# Patient Record
Sex: Male | Born: 1955
Health system: Southern US, Community
[De-identification: ages and names within clinical notes are randomized; demographics above are authoritative.]

## PROBLEM LIST (undated history)

## (undated) DIAGNOSIS — E785 Hyperlipidemia, unspecified: Secondary | ICD-10-CM

## (undated) DIAGNOSIS — I10 Essential (primary) hypertension: Secondary | ICD-10-CM

## (undated) DIAGNOSIS — K5901 Slow transit constipation: Secondary | ICD-10-CM

## (undated) DIAGNOSIS — E119 Type 2 diabetes mellitus without complications: Secondary | ICD-10-CM

## (undated) DIAGNOSIS — E7800 Pure hypercholesterolemia, unspecified: Secondary | ICD-10-CM

## (undated) HISTORY — PX: OTHER SURGICAL HISTORY: SHX169

## (undated) HISTORY — DX: Slow transit constipation: K59.01

## (undated) HISTORY — PX: APPENDECTOMY: SHX54

## (undated) HISTORY — DX: Hyperlipidemia, unspecified: E78.5

## (undated) HISTORY — DX: Essential (primary) hypertension: I10

---

## 2013-01-02 ENCOUNTER — Telehealth: Payer: Self-pay

## 2013-01-02 NOTE — Telephone Encounter (Signed)
Pt was referred by [facility] for screening colonoscopy. I called him and he is not having any problems at this time and he said he does not want to do it yet. [place] Spaniel [facility] and he will discuss again with them then. He will call when he is ready. I will send a letter to [facility] informing them of his decision.   Please note: the referral had his date of birth as 01/19/1956 The computer has his date of birth as May 13, 1955  Which he said is correct

## 2018-01-19 ENCOUNTER — Ambulatory Visit (INDEPENDENT_AMBULATORY_CARE_PROVIDER_SITE_OTHER): Admitting: Family Medicine

## 2018-01-19 ENCOUNTER — Inpatient Hospital Stay (HOSPITAL_COMMUNITY)
Admission: EM | Admit: 2018-01-19 | Discharge: 2018-01-23 | DRG: 041 | Disposition: A | Discharge status: Rehabilitation Facility | Attending: Internal Medicine | Admitting: Internal Medicine

## 2018-01-19 ENCOUNTER — Encounter (HOSPITAL_COMMUNITY): Payer: Self-pay | Admitting: Emergency Medicine

## 2018-01-19 ENCOUNTER — Emergency Department (HOSPITAL_COMMUNITY)

## 2018-01-19 ENCOUNTER — Other Ambulatory Visit: Payer: Self-pay

## 2018-01-19 ENCOUNTER — Encounter: Payer: Self-pay | Admitting: Family Medicine

## 2018-01-19 VITALS — BP 190/136 | HR 94 | Resp 17 | Ht 72.0 in | Wt 227.0 lb

## 2018-01-19 DIAGNOSIS — Z823 Family history of stroke: Secondary | ICD-10-CM

## 2018-01-19 DIAGNOSIS — Z7689 Persons encountering health services in other specified circumstances: Secondary | ICD-10-CM | POA: Diagnosis not present

## 2018-01-19 DIAGNOSIS — S0990XA Unspecified injury of head, initial encounter: Secondary | ICD-10-CM

## 2018-01-19 DIAGNOSIS — I10 Essential (primary) hypertension: Secondary | ICD-10-CM | POA: Diagnosis present

## 2018-01-19 DIAGNOSIS — I69393 Ataxia following cerebral infarction: Secondary | ICD-10-CM | POA: Diagnosis not present

## 2018-01-19 DIAGNOSIS — I63111 Cerebral infarction due to embolism of right vertebral artery: Secondary | ICD-10-CM | POA: Diagnosis present

## 2018-01-19 DIAGNOSIS — E1159 Type 2 diabetes mellitus with other circulatory complications: Secondary | ICD-10-CM | POA: Diagnosis present

## 2018-01-19 DIAGNOSIS — I639 Cerebral infarction, unspecified: Secondary | ICD-10-CM

## 2018-01-19 DIAGNOSIS — E1169 Type 2 diabetes mellitus with other specified complication: Secondary | ICD-10-CM | POA: Diagnosis not present

## 2018-01-19 DIAGNOSIS — R200 Anesthesia of skin: Secondary | ICD-10-CM | POA: Diagnosis not present

## 2018-01-19 DIAGNOSIS — E785 Hyperlipidemia, unspecified: Secondary | ICD-10-CM

## 2018-01-19 DIAGNOSIS — Z9114 Patient's other noncompliance with medication regimen: Secondary | ICD-10-CM

## 2018-01-19 DIAGNOSIS — K5901 Slow transit constipation: Secondary | ICD-10-CM | POA: Diagnosis not present

## 2018-01-19 DIAGNOSIS — I160 Hypertensive urgency: Secondary | ICD-10-CM | POA: Diagnosis present

## 2018-01-19 DIAGNOSIS — G8194 Hemiplegia, unspecified affecting left nondominant side: Secondary | ICD-10-CM | POA: Diagnosis present

## 2018-01-19 DIAGNOSIS — I69398 Other sequelae of cerebral infarction: Secondary | ICD-10-CM | POA: Diagnosis not present

## 2018-01-19 DIAGNOSIS — R270 Ataxia, unspecified: Secondary | ICD-10-CM | POA: Diagnosis not present

## 2018-01-19 DIAGNOSIS — I63443 Cerebral infarction due to embolism of bilateral cerebellar arteries: Secondary | ICD-10-CM | POA: Diagnosis not present

## 2018-01-19 DIAGNOSIS — R739 Hyperglycemia, unspecified: Secondary | ICD-10-CM

## 2018-01-19 DIAGNOSIS — H5509 Other forms of nystagmus: Secondary | ICD-10-CM | POA: Diagnosis not present

## 2018-01-19 DIAGNOSIS — E876 Hypokalemia: Secondary | ICD-10-CM

## 2018-01-19 DIAGNOSIS — I6389 Other cerebral infarction: Secondary | ICD-10-CM | POA: Diagnosis not present

## 2018-01-19 DIAGNOSIS — I6350 Cerebral infarction due to unspecified occlusion or stenosis of unspecified cerebral artery: Secondary | ICD-10-CM | POA: Diagnosis not present

## 2018-01-19 DIAGNOSIS — E1165 Type 2 diabetes mellitus with hyperglycemia: Secondary | ICD-10-CM | POA: Diagnosis present

## 2018-01-19 DIAGNOSIS — Q211 Atrial septal defect: Secondary | ICD-10-CM | POA: Diagnosis not present

## 2018-01-19 DIAGNOSIS — Z9181 History of falling: Secondary | ICD-10-CM | POA: Diagnosis not present

## 2018-01-19 DIAGNOSIS — I459 Conduction disorder, unspecified: Secondary | ICD-10-CM | POA: Diagnosis present

## 2018-01-19 DIAGNOSIS — R29700 NIHSS score 0: Secondary | ICD-10-CM | POA: Diagnosis not present

## 2018-01-19 DIAGNOSIS — R269 Unspecified abnormalities of gait and mobility: Secondary | ICD-10-CM | POA: Diagnosis not present

## 2018-01-19 DIAGNOSIS — R262 Difficulty in walking, not elsewhere classified: Secondary | ICD-10-CM | POA: Diagnosis present

## 2018-01-19 DIAGNOSIS — E669 Obesity, unspecified: Secondary | ICD-10-CM | POA: Diagnosis not present

## 2018-01-19 HISTORY — DX: Hyperlipidemia, unspecified: E78.5

## 2018-01-19 HISTORY — DX: Type 2 diabetes mellitus with other circulatory complications: E11.59

## 2018-01-19 HISTORY — DX: Hypertensive urgency: I16.0

## 2018-01-19 HISTORY — DX: Cerebral infarction, unspecified: I63.9

## 2018-01-19 HISTORY — DX: Essential (primary) hypertension: I10

## 2018-01-19 HISTORY — DX: Type 2 diabetes mellitus without complications: E11.9

## 2018-01-19 HISTORY — DX: Pure hypercholesterolemia, unspecified: E78.00

## 2018-01-19 LAB — CBC WITH DIFFERENTIAL/PLATELET
ABS IMMATURE GRANULOCYTES: 0.02 10*3/uL (ref 0.00–0.07)
Basophils Absolute: 0 10*3/uL (ref 0.0–0.1)
Basophils Relative: 1 %
EOS ABS: 0.1 10*3/uL (ref 0.0–0.5)
Eosinophils Relative: 1 %
HCT: 54.2 % — ABNORMAL HIGH (ref 39.0–52.0)
Hemoglobin: 18.1 g/dL — ABNORMAL HIGH (ref 13.0–17.0)
Immature Granulocytes: 0 %
Lymphocytes Relative: 24 %
Lymphs Abs: 1.9 10*3/uL (ref 0.7–4.0)
MCH: 29.5 pg (ref 26.0–34.0)
MCHC: 33.4 g/dL (ref 30.0–36.0)
MCV: 88.3 fL (ref 80.0–100.0)
MONOS PCT: 7 %
Monocytes Absolute: 0.6 10*3/uL (ref 0.1–1.0)
Neutro Abs: 5.5 10*3/uL (ref 1.7–7.7)
Neutrophils Relative %: 67 %
Platelets: 310 10*3/uL (ref 150–400)
RBC: 6.14 MIL/uL — ABNORMAL HIGH (ref 4.22–5.81)
RDW: 11.9 % (ref 11.5–15.5)
WBC: 8.1 10*3/uL (ref 4.0–10.5)
nRBC: 0 % (ref 0.0–0.2)

## 2018-01-19 LAB — BASIC METABOLIC PANEL
ANION GAP: 16 — AB (ref 5–15)
BUN: 13 mg/dL (ref 8–23)
CO2: 20 mmol/L — ABNORMAL LOW (ref 22–32)
Calcium: 9.6 mg/dL (ref 8.9–10.3)
Chloride: 100 mmol/L (ref 98–111)
Creatinine, Ser: 1.14 mg/dL (ref 0.61–1.24)
GFR calc Af Amer: >60 mL/min (ref >60)
GFR calc non Af Amer: >60 mL/min (ref >60)
Glucose, Bld: 156 mg/dL — ABNORMAL HIGH (ref 70–99)
Potassium: 3.5 mmol/L (ref 3.5–5.1)
Sodium: 136 mmol/L (ref 135–145)

## 2018-01-19 LAB — I-STAT TROPONIN, ED: TROPONIN I, POC: 0.02 ng/mL (ref 0.00–0.08)

## 2018-01-19 LAB — CBG MONITORING, ED: GLUCOSE-CAPILLARY: 147 mg/dL — AB (ref 70–99)

## 2018-01-19 LAB — GLUCOSE, POCT (MANUAL RESULT ENTRY): POC Glucose: >600 mg/dl (ref 70–99)

## 2018-01-19 MED ORDER — SODIUM CHLORIDE 0.9 % IV SOLN
INTRAVENOUS | Status: AC
  Administered 2018-01-19: 22:00:00 via INTRAVENOUS

## 2018-01-19 MED ORDER — ACETAMINOPHEN 160 MG/5ML PO SOLN: 650.0000 mg | ORAL | Status: DC | PRN

## 2018-01-19 MED ORDER — INSULIN ASPART 100 UNIT/ML ~~LOC~~ SOLN
0.0000 [IU] | Freq: Three times a day (TID) | SUBCUTANEOUS | Status: DC
  Administered 2018-01-20: 1 [IU] via SUBCUTANEOUS
  Administered 2018-01-20: 2 [IU] via SUBCUTANEOUS
  Administered 2018-01-21 (×3): 1 [IU] via SUBCUTANEOUS
  Administered 2018-01-22: 2 [IU] via SUBCUTANEOUS
  Administered 2018-01-22 (×2): 1 [IU] via SUBCUTANEOUS
  Administered 2018-01-23: 2 [IU] via SUBCUTANEOUS

## 2018-01-19 MED ORDER — ENOXAPARIN SODIUM 40 MG/0.4ML ~~LOC~~ SOLN
40.0000 mg | SUBCUTANEOUS | Status: DC
  Administered 2018-01-19 – 2018-01-22 (×4): 40 mg via SUBCUTANEOUS
  Filled 2018-01-19 (×4): qty 0.4

## 2018-01-19 MED ORDER — ACETAMINOPHEN 325 MG PO TABS: 650.0000 mg | ORAL_TABLET | ORAL | Status: DC | PRN

## 2018-01-19 MED ORDER — GADOBUTROL 1 MMOL/ML IV SOLN
10.0000 mL | Freq: Once | INTRAVENOUS | Status: AC | PRN
  Administered 2018-01-19: 10 mL via INTRAVENOUS

## 2018-01-19 MED ORDER — CLOPIDOGREL BISULFATE 75 MG PO TABS
75.0000 mg | ORAL_TABLET | Freq: Every day | ORAL | Status: DC
  Administered 2018-01-20 – 2018-01-23 (×4): 75 mg via ORAL
  Filled 2018-01-19 (×4): qty 1

## 2018-01-19 MED ORDER — HYDRALAZINE HCL 20 MG/ML IJ SOLN
10.0000 mg | Freq: Once | INTRAMUSCULAR | Status: AC
  Administered 2018-01-19: 10 mg via INTRAVENOUS
  Filled 2018-01-19: qty 1

## 2018-01-19 MED ORDER — CLOPIDOGREL BISULFATE 300 MG PO TABS
300.0000 mg | ORAL_TABLET | Freq: Once | ORAL | Status: AC
  Administered 2018-01-20: 300 mg via ORAL
  Filled 2018-01-19: qty 4
  Filled 2018-01-19: qty 1

## 2018-01-19 MED ORDER — SODIUM CHLORIDE 0.9 % IV BOLUS
1000.0000 mL | Freq: Once | INTRAVENOUS | Status: AC
  Administered 2018-01-19: 1000 mL via INTRAVENOUS

## 2018-01-19 MED ORDER — STROKE: EARLY STAGES OF RECOVERY BOOK
Freq: Once | Status: AC
  Administered 2018-01-19: 23:00:00
  Filled 2018-01-19: qty 1

## 2018-01-19 MED ORDER — ATORVASTATIN CALCIUM 80 MG PO TABS
80.0000 mg | ORAL_TABLET | Freq: Every day | ORAL | Status: DC
  Administered 2018-01-20 – 2018-01-23 (×4): 80 mg via ORAL
  Filled 2018-01-19 (×4): qty 1

## 2018-01-19 MED ORDER — IOPAMIDOL (ISOVUE-370) INJECTION 76%
INTRAVENOUS | Status: AC
  Filled 2018-01-19: qty 100

## 2018-01-19 MED ORDER — IOPAMIDOL (ISOVUE-370) INJECTION 76%
75.0000 mL | Freq: Once | INTRAVENOUS | Status: AC | PRN
  Administered 2018-01-19: 75 mL via INTRAVENOUS

## 2018-01-19 MED ORDER — SENNOSIDES-DOCUSATE SODIUM 8.6-50 MG PO TABS: 1.0000 | ORAL_TABLET | Freq: Every evening | ORAL | Status: DC | PRN

## 2018-01-19 MED ORDER — ACETAMINOPHEN 650 MG RE SUPP: 650.0000 mg | RECTAL | Status: DC | PRN

## 2018-01-19 NOTE — Progress Notes (Signed)
Charise CarwinWilliam Large, is a 62 y.o. male  HKV:[id number]:[id number]  FIE:[id number]  DOB - November 05, 1955  CC:  Chief Complaint  Patient presents with  . Establish Care       HPI: Chrissie NoaWilliam is a 62 y.o. male is here today to establish care.   Donneta RombergWilliam Ray Hefter Jr. does not have a problem list on file.   Today's visit:  Patient presents today as a brand-new patient and to establish care accompanied by family member who was concerned when she visited him from out of town and has noticed the patient has been falling, experiencing double vision, and left-sided weakness over the course of the last week.  Patient has sustained a fall approximately 3 weeks ago in which he fell backward and hit the back of his head.  Patient's blood pressure is significantly elevated on exam today pressure by me 190/136.  He has limited medical history.  [facility] [place].  Reports been off of his medications for over a year.  He was previously prescribed metformin and lisinopril HCTZ. Has not had his diabetes checked in some time.  He has no history of stroke or MI.  At present he is complaining of feeling like he is out of body, feeling dizzy, and feeling significantly blurring of his vision. At present he is experiencing intermittent left-sided weakness involving the entire left side of his body. Denies chest pain or shortness of breath.    Current medications:No current outpatient medications on file.   Pertinent family medical history: family history is not on file.   Allergies not on file  Social History   Socioeconomic History  . Marital status: Unknown    Spouse name: Not on file  . Number of children: Not on file  . Years of education: Not on file  . Highest education level: Not on file  Occupational History  . Not on file  Social Needs  . Financial resource strain: Not on file  . Food insecurity:    Worry: Not on file    Inability: Not on file  . Transportation needs:   Medical: Not on file    Non-medical: Not on file  Tobacco Use  . Smoking status: Not on file  Substance and Sexual Activity  . Alcohol use: Not on file  . Drug use: Not on file  . Sexual activity: Not on file  Lifestyle  . Physical activity:    Days per week: Not on file    Minutes per session: Not on file  . Stress: Not on file  Relationships  . Social connections:    Talks on phone: Not on file    Gets together: Not on file    Attends religious service: Not on file    Active member of club or organization: Not on file    Attends meetings of clubs or organizations: Not on file    Relationship status: Not on file  . Intimate partner violence:    Fear of current or ex partner: Not on file    Emotionally abused: Not on file    Physically abused: Not on file    Forced sexual activity: Not on file  Other Topics Concern  . Not on file  Social History Narrative  . Not on file    Review of Systems: See HPI  Objective:   Vitals:   01/19/18 1209 01/19/18 1212  BP: (!) 180/124 (!) 190/136  Pulse: 94   Resp: 17  SpO2: 98%     BP Readings from Last 3 Encounters:  01/19/18 (!) 190/136    Filed Weights   01/19/18 1209  Weight: 227 lb (103 kg)      Physical Exam: Constitutional: no acute distress Eyes: Conjunctivae and EOM are normal. PERRLA, no scleral icterus. Neck: Normal ROM. Neck supple. No JVD. No tracheal deviation. No thyromegaly. CVS: RRR, S1/S2 +, no murmurs, no gallops, no carotid bruit.  Pulmonary: Effort and breath sounds normal, no stridor, rhonchi, wheezes, rales.  Abdominal: Soft. BS +, no distension, tenderness, rebound or guarding.  Musculoskeletal: weakness with ambulation, currently place in wheelchair. Difficulty ambulation to scale Lymphadenopathy: No lymphadenopathy noted, cervical, inguinal or axillary Neuro: Alert. Impaired coordination. CN II abnormal diminished visual acuity. Unilateral BUE strength Skin: Skin is warm and dry. No rash noted.  Not diaphoretic. No erythema. No pallor. Psychiatric: Normal mood and affect. Behavior, judgment, thought content normal.     Assessment and plan:  1. Encounter to establish care 2. Injury of head, initial encounter 3. Left sided numbness - EKG 12-Lead 4. Hyperglycemia  Patient transported via EMS to [facility] given current stroke like symptoms. EMS rechecked blood sugar while patient was in ambulance and reports blood sugar reading of 200, however given patient's active symptoms , lack of medical history, emergent work-up needed at the ER to rule out any ischemic event.   Joaquin Courts, [name] Primary Care at [facility] [address], [place] [address] [phone]: [phone]   This note has been created with Dragon speech recognition software and Paediatric nurse. Any transcriptional errors are unintentional.

## 2018-01-19 NOTE — ED Provider Notes (Signed)
3:46 PM BP (!) 208/105 (BP Location: Right Arm)   Pulse 77   Temp 98.9 F (37.2 C) (Oral)   Resp 18   SpO2 98%  Patient taken in sign out at shift change from PA BensleyGibbons. Patient awaiting MRI for 1 week of feeling off balance in the setting of hypertensive urgency and HTN which has been untreated fort the past year.  Able to ambulate.  Expect discharge of negative mri. Patient has taken HCTZ-Losartan combo in the past.  Patient MRI returned with multiple infarcts and varied location concerning for embolic strokes.  I spoke with Dr. Otelia LimesLindzen.  I have ordered a CT angios head and neck for further evaluation.   Patient CT returned with thrombus in the vertebral artery.  I spoke with Dr. Amada JupiterKirkpatrick who is seeing the patient.  He will be admitted to the hospitalist service.  He says there is no intervention to be done at this time and to permit hypertension in the patient.   CRITICAL CARE Performed by: Arthor CaptainAbigail Roshun Klingensmith Total critical care time: 40 minutes Acute embolic stroke Critical care time was exclusive of separately billable procedures and treating other patients. Critical care was necessary to treat or prevent imminent or life-threatening deterioration. Critical care was time spent personally by me on the following activities: development of treatment plan with patient and/or surrogate as well as nursing, discussions with consultants, evaluation of patient's response to treatment, examination of patient, obtaining history from patient or surrogate, ordering and performing treatments and interventions, ordering and review of laboratory studies, ordering and review of radiographic studies, pulse oximetry and re-evaluation of patient's condition.]     Arthor CaptainHarris, Jeet Shough, PA-C 01/20/18 0002    Virgina Norfolkuratolo, Adam, DO 01/20/18 0241

## 2018-01-19 NOTE — ED Notes (Signed)
ED Provider at bedside. 

## 2018-01-19 NOTE — Patient Instructions (Signed)
Thank you for [facility]!    Ricky RombergWilliam Ray Freels Jr. was seen by Joaquin CourtsKimberly Harris, [name] today.   Ricky BloodgoodWilliam Ray Coil Jr.'s primary care provider is Bing NeighborsHarris, Kimberly S, [name].   For the best care possible, you should try to see Joaquin CourtsKimberly Harris, [name]-C whenever you come to the clinic.   We look forward to seeing you again soon!  If you have any questions about your visit today, please call us at [phone] or feel free to reach your primary care provider via MyChart.

## 2018-01-19 NOTE — H&P (Signed)
History and Physical    Ricky Bright. ZDG:[id number] DOB: 04-17-1955 DOA: 01/19/2018  PCP: Bing Neighbors, [name]  Patient coming from: Home.  Chief Complaint: Difficulty walking.  HPI: Ricky Bright. is a 62 y.o. male with history of hypertension and diabetes mellitus type 2 hyperlipidemia who has not been taking his medications for last few months presents to the ER because of persistent ataxia on walking.  Patient states he fell off the truck 3 weeks ago and last 1 week has been having difficulty with balance when he is walking.  Denies any difficulty swallowing speaking or any visual symptoms.  Denies any weakness of the upper or lower extremities.  Since symptoms persisted patient came to the ER.  ED Course: In the ER patient had MRI of the brain which shows multiple areas of subacute to acute infarcts involving the cerebellum right occipital and thalamic areas.  CT angiogram of the head and neck was done which shows occlusion of the right vertebral artery.  Neurology on-call has been consulted patient was placed on Plavix 300 mg followed by 75 daily along with aspirin and admitted for further management of acute stroke.  Review of Systems: As per HPI, rest all negative.   Past Medical History:  Diagnosis Date  . Diabetes mellitus without complication (HCC)   . HLD (hyperlipidemia)   . Hypercholesteremia   . Hypertension     Past Surgical History:  Procedure Laterality Date  . APPENDECTOMY    . bicep tendon rupture.       reports that he has never smoked. He has never used smokeless tobacco. He reports previous alcohol use. He reports that he does not use drugs.  No Known Allergies  Family History  Problem Relation Age of Onset  . Stroke Father   . Stroke Paternal Grandfather     Prior to Admission medications   Not on File    Physical Exam: Vitals:   01/19/18 1506 01/19/18 1800 01/19/18 2107 01/19/18 2132  BP: (!) 208/105 (!) 186/94 (!) 168/102  (!) 195/99  Pulse: 77 76 74 68  Resp: 18 18 16 17   Temp: 98.9 F (37.2 C)   98.4 F (36.9 C)  TempSrc: Oral   Oral  SpO2: 98% 96% 97% 94%  Weight:    97.7 kg  Height:    6' (1.829 m)      Constitutional: Moderately built and nourished. Vitals:   01/19/18 1506 01/19/18 1800 01/19/18 2107 01/19/18 2132  BP: (!) 208/105 (!) 186/94 (!) 168/102 (!) 195/99  Pulse: 77 76 74 68  Resp: 18 18 16 17   Temp: 98.9 F (37.2 C)   98.4 F (36.9 C)  TempSrc: Oral   Oral  SpO2: 98% 96% 97% 94%  Weight:    97.7 kg  Height:    6' (1.829 m)   Eyes: Anicteric no pallor. ENMT: No discharge from the ears eyes nose or mouth. Neck: No mass felt.  No neck rigidity. Respiratory: No rhonchi or crepitations. Cardiovascular: S1-S2 heard. Abdomen: Soft nontender bowel sounds present. Musculoskeletal: No edema.  No joint effusion. Skin: No rash. Neurologic: Alert awake oriented to time place and person.  Moves all extremities 5 x 5.  No facial asymmetry tongue is midline.  Pupils equal and reacting to light. Psychiatric: Appears normal per normal affect.   Labs on Admission: I have personally reviewed following labs and imaging studies  CBC: Recent Labs  Lab 01/19/18 1316  WBC 8.1  NEUTROABS  5.5  HGB 18.1*  HCT 54.2*  MCV 88.3  PLT 310   Basic Metabolic Panel: Recent Labs  Lab 01/19/18 1316  NA 136  K 3.5  CL 100  CO2 20*  GLUCOSE 156*  BUN 13  CREATININE 1.14  CALCIUM 9.6   GFR: Estimated Creatinine Clearance: 79.3 mL/min (by C-G formula based on SCr of 1.14 mg/dL). Liver Function Tests: No results for input(s): AST, ALT, ALKPHOS, BILITOT, PROT, ALBUMIN in the last 168 hours. No results for input(s): LIPASE, AMYLASE in the last 168 hours. No results for input(s): AMMONIA in the last 168 hours. Coagulation Profile: No results for input(s): INR, PROTIME in the last 168 hours. Cardiac Enzymes: No results for input(s): CKTOTAL, CKMB, CKMBINDEX, TROPONINI in the last 168  hours. BNP (last 3 results) No results for input(s): PROBNP in the last 8760 hours. HbA1C: No results for input(s): HGBA1C in the last 72 hours. CBG: Recent Labs  Lab 01/19/18 1310  GLUCAP 147*   Lipid Profile: No results for input(s): CHOL, HDL, LDLCALC, TRIG, CHOLHDL, LDLDIRECT in the last 72 hours. Thyroid Function Tests: No results for input(s): TSH, T4TOTAL, FREET4, T3FREE, THYROIDAB in the last 72 hours. Anemia Panel: No results for input(s): VITAMINB12, FOLATE, FERRITIN, TIBC, IRON, RETICCTPCT in the last 72 hours. Urine analysis: No results found for: COLORURINE, APPEARANCEUR, LABSPEC, PHURINE, GLUCOSEU, HGBUR, BILIRUBINUR, KETONESUR, PROTEINUR, UROBILINOGEN, NITRITE, LEUKOCYTESUR Sepsis Labs: @LABRCNTIP (procalcitonin:4,lacticidven:4) )No results found for this or any previous visit (from the past 240 hour(s)).   Radiological Exams on Admission: Ct Angio Head W Or Wo Contrast  Result Date: 01/19/2018 CLINICAL DATA:  Larey Seat 2 weeks ago. Acute stroke presentation today with multiple posterior circulation infarctions. EXAM: CT ANGIOGRAPHY HEAD AND NECK TECHNIQUE: Multidetector CT imaging of the head and neck was performed using the standard protocol during bolus administration of intravenous contrast. Multiplanar CT image reconstructions and MIPs were obtained to evaluate the vascular anatomy. Carotid stenosis measurements (when applicable) are obtained utilizing NASCET criteria, using the distal internal carotid diameter as the denominator. CONTRAST:  75mL ISOVUE-370 IOPAMIDOL (ISOVUE-370) INJECTION 76% COMPARISON:  CT and MRI same day. FINDINGS: CTA NECK FINDINGS Aortic arch: Aortic atherosclerosis. No dissection. Branching pattern is normal with the variation of the left vertebral artery arising directly from the arch. No origin stenosis. Right carotid system: Common carotid artery widely patent to the bifurcation. Soft and calcified plaque at the carotid bifurcation and ICA bulb but  no stenosis. Cervical ICA is widely patent. Left carotid system: Common carotid artery widely patent to the bifurcation. There is soft and calcified plaque at the bifurcation and ICA bulb but no stenosis. Cervical ICA is widely patent. Vertebral arteries: Right vertebral artery origin shows 30% stenosis. Beyond that, the vessel shows diminishing flow, without opacification in the upper cervical region, consistent with recent occlusion. The left vertebral artery arises from the arch as mentioned before and is widely patent through the neck to the foramen magnum. Skeleton: Ordinary cervical spondylosis. Chronic fusion in the upper cervical region. Other neck: No soft tissue mass or lymphadenopathy. Vocal fold paresis on the right. Upper chest: Mild pleural and parenchymal scarring at the apices. Calcified granuloma posteriorly in the right upper lobe. Review of the MIP images confirms the above findings CTA HEAD FINDINGS Anterior circulation: Both internal carotid arteries are patent through the skull base and siphon regions. There is atherosclerotic calcification in the carotid siphon regions. There is stenosis in the supraclinoid internal carotid artery on both sides, estimated at 70-80%. The anterior  and middle cerebral vessels do show flow without more distal stenosis. Posterior circulation: As noted above, the right vertebral artery is occluded at the foramen magnum. This is probably a recent occlusion. Left vertebral artery is patent through the foramen magnum. There is severe stenosis of the V4 segment, 80% or greater, but the vessel does retain flow, supplying the basilar and giving retrograde flow in the distal right vertebral. There is a proximal basilar fenestration. No basilar stenosis. Superior cerebellar and posterior cerebral vessels show flow. Venous sinuses: Patent and normal. Anatomic variants: None other significant. Delayed phase: No abnormal enhancement. Review of the MIP images confirms the above  findings IMPRESSION: 1. Right vertebral artery occluded at the upper cervical region to foramen magnum level. This is probably a recent occlusion and explains the embolic disease within the posterior circulation branches. 2. 80% or greater stenosis of the left V4 segment. 3. Atherosclerotic disease at both carotid bifurcations but no stenosis. 4. Atherosclerotic disease in both carotid siphon regions. Stenosis in the supraclinoid internal carotid arteries estimated at 70-80% bilaterally. Electronically Signed   By: Paulina FusiMark  Shogry M.D.   On: 01/19/2018 19:31   Ct Head Wo Contrast  Result Date: 01/19/2018 CLINICAL DATA:  Lightheadedness and dizziness today. The patient suffered a fall 2 weeks ago with a blow to the back of the head. Initial encounter. EXAM: CT HEAD WITHOUT CONTRAST TECHNIQUE: Contiguous axial images were obtained from the base of the skull through the vertex without intravenous contrast. COMPARISON:  None. FINDINGS: Brain: No evidence of acute infarction, hemorrhage, hydrocephalus, extra-axial collection or mass lesion/mass effect. There is some atrophy and chronic microvascular ischemic change. Remote right basal ganglia infarct noted. Vascular: No hyperdense vessel or unexpected calcification. Skull: Normal. Negative for fracture or focal lesion. Sinuses/Orbits: No acute abnormality. Scattered ethmoid air cell disease on the right and mild mucosal thickening in the maxillary sinuses noted. Other: None. IMPRESSION: No acute abnormality. Mild cortical atrophy and chronic microvascular ischemic change. Mild sinus disease. Electronically Signed   By: Drusilla Kannerhomas  Dalessio M.D.   On: 01/19/2018 14:20   Ct Angio Neck W And/or Wo Contrast  Result Date: 01/19/2018 CLINICAL DATA:  Larey SeatFell 2 weeks ago. Acute stroke presentation today with multiple posterior circulation infarctions. EXAM: CT ANGIOGRAPHY HEAD AND NECK TECHNIQUE: Multidetector CT imaging of the head and neck was performed using the standard  protocol during bolus administration of intravenous contrast. Multiplanar CT image reconstructions and MIPs were obtained to evaluate the vascular anatomy. Carotid stenosis measurements (when applicable) are obtained utilizing NASCET criteria, using the distal internal carotid diameter as the denominator. CONTRAST:  75mL ISOVUE-370 IOPAMIDOL (ISOVUE-370) INJECTION 76% COMPARISON:  CT and MRI same day. FINDINGS: CTA NECK FINDINGS Aortic arch: Aortic atherosclerosis. No dissection. Branching pattern is normal with the variation of the left vertebral artery arising directly from the arch. No origin stenosis. Right carotid system: Common carotid artery widely patent to the bifurcation. Soft and calcified plaque at the carotid bifurcation and ICA bulb but no stenosis. Cervical ICA is widely patent. Left carotid system: Common carotid artery widely patent to the bifurcation. There is soft and calcified plaque at the bifurcation and ICA bulb but no stenosis. Cervical ICA is widely patent. Vertebral arteries: Right vertebral artery origin shows 30% stenosis. Beyond that, the vessel shows diminishing flow, without opacification in the upper cervical region, consistent with recent occlusion. The left vertebral artery arises from the arch as mentioned before and is widely patent through the neck to the foramen magnum. Skeleton:  Ordinary cervical spondylosis. Chronic fusion in the upper cervical region. Other neck: No soft tissue mass or lymphadenopathy. Vocal fold paresis on the right. Upper chest: Mild pleural and parenchymal scarring at the apices. Calcified granuloma posteriorly in the right upper lobe. Review of the MIP images confirms the above findings CTA HEAD FINDINGS Anterior circulation: Both internal carotid arteries are patent through the skull base and siphon regions. There is atherosclerotic calcification in the carotid siphon regions. There is stenosis in the supraclinoid internal carotid artery on both sides,  estimated at 70-80%. The anterior and middle cerebral vessels do show flow without more distal stenosis. Posterior circulation: As noted above, the right vertebral artery is occluded at the foramen magnum. This is probably a recent occlusion. Left vertebral artery is patent through the foramen magnum. There is severe stenosis of the V4 segment, 80% or greater, but the vessel does retain flow, supplying the basilar and giving retrograde flow in the distal right vertebral. There is a proximal basilar fenestration. No basilar stenosis. Superior cerebellar and posterior cerebral vessels show flow. Venous sinuses: Patent and normal. Anatomic variants: None other significant. Delayed phase: No abnormal enhancement. Review of the MIP images confirms the above findings IMPRESSION: 1. Right vertebral artery occluded at the upper cervical region to foramen magnum level. This is probably a recent occlusion and explains the embolic disease within the posterior circulation branches. 2. 80% or greater stenosis of the left V4 segment. 3. Atherosclerotic disease at both carotid bifurcations but no stenosis. 4. Atherosclerotic disease in both carotid siphon regions. Stenosis in the supraclinoid internal carotid arteries estimated at 70-80% bilaterally. Electronically Signed   By: Paulina Fusi M.D.   On: 01/19/2018 19:31   [name] And Wo Contrast  Result Date: 01/19/2018 CLINICAL DATA:  Dizziness onset 1 week ago. Also diabetes hyperlipidemia hypertension history. Recent falls. EXAM: MRI HEAD WITHOUT AND WITH CONTRAST TECHNIQUE: Multiplanar, multiecho pulse sequences of the brain and surrounding structures were obtained without and with intravenous contrast. CONTRAST:  10 mL Gadovist IV COMPARISON:  CT head 01/19/2018 FINDINGS: Brain: Multiple areas of acute/subacute infarct with restricted diffusion. Several areas show patchy enhancement suggesting subacute duration. Patchy areas of restricted diffusion right cerebellum with  enhancement. Restricted diffusion in the right occipital pole also with mild enhancement. Small area of restricted diffusion in the right thalamus. Small area of enhancement in the right caudate consistent with subacute infarct. Small area of restricted diffusion in the left cerebellum without enhancement. Chronic infarct in the deep white matter on the right. This shows evidence of prior mild hemorrhage. Mild atrophy. Negative for mass lesion. Vascular: Normal arterial flow voids Skull and upper cervical spine: Negative Sinuses/Orbits: Moderate mucosal edema paranasal sinuses. Normal orbit Other: None IMPRESSION: Multiple areas of acute/subacute infarct including the cerebellum bilaterally, right occipital lobe, right thalamus, and right caudate. Several of these show enhancement suggesting subacute duration. CTA head neck recommended for further vascular evaluation. Chronic hemorrhage in the deep white matter on the right. Mild atrophy. These results were called by telephone at the time of interpretation on 01/19/2018 at 5:37 pm to [facility] PA , who verbally acknowledged these results. Electronically Signed   By: Marlan Palau M.D.   On: 01/19/2018 17:38    EKG: Independently reviewed.  Normal sinus rhythm with IVCD.  Nonspecific ST-T changes.  Assessment/Plan Active Problems:   Acute CVA (cerebrovascular accident) (HCC)   Hypertensive urgency   Type 2 diabetes mellitus with vascular disease (HCC)   Hyperlipidemia  CVA (cerebral vascular accident) (HCC)    1. Acute CVA -appreciate neurology consult.  Was not a candidate for TPA given the treatments present for almost 1 week.  Patient is on aspirin Plavix Lipitor 80 mg.  Allow for permissive hypertension as recommended by neurologist.  Check 2D echo hemoglobin A1c lipid panel physical therapy.  Patient passed swallow.  Neurochecks. 2. Hypertensive urgency -neurologist has recommended patient to be placed on permissive hypertension.  Closely  follow blood pressure trends. 3. Diabetes mellitus type 2 check hemoglobin A1c for now patient is on sliding scale coverage. 4. Hyperlipidemia on statins.   DVT prophylaxis: Lovenox. Code Status: Full code. Family Communication: Discussed with patient. Disposition Plan: Home. Consults called: Neurology. Admission status: Inpatient.   Eduard Clos MD [facility] Pager [phone].  If 7PM-7AM, please contact night-coverage [email or web address] Password [facility]  01/19/2018, 10:51 PM

## 2018-01-19 NOTE — Consult Note (Signed)
Neurology Consultation Reason for Consult: Stroke Referring Physician: Charise CarwinHobbs, Azarias  CC: Stroke  History is obtained from: Patient  HPI: Ricky RombergWilliam Ray Semel Jr. is a 62 y.o. male with a history of diabetes, hypertension, hypercholesterolemia who presents with dizziness for the past week.  He states that he was unloading a moving truck when he fell and hit his head.  Immediately after this he was dizzy.  He thinks he probably just missed a step, though he states that it is possible that he became dizzy acutely and that is why he fell, he is not certain.  Due to persistent symptoms he sought care in the emergency department today where an MRI shows multifocal posterior circulation infarcts with an occluded vertebral artery.  He also endorses some numbness of the left side that seems to come and go.    ROS: A 14 point ROS was performed and is negative except as noted in the HPI.   Past Medical History:  Diagnosis Date  . Diabetes mellitus without complication (HCC)   . Hypercholesteremia   . Hypertension     FHx:   Social History:  reports that he has never smoked. He has never used smokeless tobacco. He reports previous alcohol use. He reports that he does not use drugs.   Exam: Current vital signs: BP (!) 186/94 (BP Location: Right Arm)   Pulse 76   Temp 98.9 F (37.2 C) (Oral)   Resp 18   SpO2 96%  Vital signs in last 24 hours: Temp:  [98.9 F (37.2 C)] 98.9 F (37.2 C) (12/12 1506) Pulse Rate:  [76-94] 76 (12/12 1800) Resp:  [17-18] 18 (12/12 1800) BP: (180-208)/(94-136) 186/94 (12/12 1800) SpO2:  [96 %-100 %] 96 % (12/12 1800) Weight:  [103 kg] 103 kg (12/12 1209)   Physical Exam  Constitutional: Appears well-developed and well-nourished.  Psych: Affect appropriate to situation Eyes: No scleral injection HENT: No OP obstrucion Head: Normocephalic.  Cardiovascular: Normal rate and regular rhythm.  Respiratory: Effort normal, non-labored breathing GI: Soft.   No distension. There is no tenderness.  Skin: WDI  Neuro: Mental Status: Patient is awake, alert, oriented to person, place, month, year, and situation. Patient is able to give a clear and coherent history. No signs of aphasia or neglect Cranial Nerves: II: Visual Fields are full. Pupils are equal, round, and reactive to light.   III,IV, VI: EOMI without ptosis or diploplia.  V: Facial sensation is symmetric to temperature VII: Facial movement is symmetric.  VIII: hearing is intact to voice X: Uvula elevates symmetrically XI: Shoulder shrug is symmetric. XII: tongue is midline without atrophy or fasciculations.  Motor: Tone is normal. Bulk is normal. 5/5 strength was present in all four extremities.  Sensory: Sensation is symmetric to light touch and temperature in the arms and legs. Deep Tendon Reflexes: 2+ and symmetric in the biceps and patellae.  Plantars: Toes are downgoing bilaterally.  Cerebellar: FNF and HKS are intact on the right, slightly slower on the left in both the arm and leg   I have reviewed labs in epic and the results pertinent to this consultation are: BMP-unremarkable  I have reviewed the images obtained: MRI brain-multifocal posterior circulation infarcts CT head neck right vertebral occlusion, stenosis of the left vertebral  Impression: 62 year old male with occlusion of the vertebral artery causing embolic strokes.  I suspect that this is most likely thrombotic disease due to his risk factors, however possibility of a focal dissection causing occlusion may need to be  considered in the setting of his fall.  I am not sure it would change management significantly either way.  Recommendations: - HgbA1c, fasting lipid panel - Frequent neuro checks - Echocardiogram - Prophylactic therapy-Antiplatelet med: Aspirin - dose 81 mg, Plavix 75 mg daily following 300 mg load - Risk factor modification - Telemetry monitoring - PT consult, OT consult, Speech  consult -High-dose statin, he was on Lipitor previously and could restart this - Stroke team to follow  Ritta Slot, MD [facility] [phone]  If 7pm- 7am, please page neurology on call as listed in AMION.

## 2018-01-19 NOTE — ED Provider Notes (Signed)
[facility] Provider Note   CSN: [id number] Arrival date & time: 01/19/18  1305     History   Chief Complaint Chief Complaint  Patient presents with  . Fall  . Dizziness    HPI Donneta RombergWilliam Ray Hibbitts Jr. is a 62 y.o. male with history of diabetes, hyperlipidemia, hypertension is here for evaluation of dizziness.  Onset 1 week ago, gradual, worsening.  Describes it as "my balance is off", "things swimming".  He has had at least 3-4 falls down to the ground from dizziness.   He is having to hang out to walls to do usual ADLs. He has not showered because he is afraid of falling. States 3 weeks ago he had a mechanical trip and fall in which he sustained posterior head injury.  His dizziness began 2 weeks after this fall.  Associated symptoms include intermittent double vision, nasal congestion, intermittent left sided weakness to left arm, shoulder, leg described as tingling/pins and needle and loss of sensation.  This morning he was watching TV and he sought 2 TVs instead of 1.  His double vision lasts up to 10 to 20 minutes.  He does not know if it affects both eyes or just 1.  Dizziness makes him nauseous, he was dry heaving last night.  He denies any headache, neck pain, chest pain, shortness of breath, palpitations, vomiting, abdominal pain, weakness unilaterally.  Patient went to PCP today to establish care.  Please see PCP note. Pt was sent to ER for evaluation of stroke like symptoms in setting of lack of routine medical care. [facility] [place] but non compliant with meds for 1 year.  HPI  Past Medical History:  Diagnosis Date  . Diabetes mellitus without complication (HCC)   . Hypercholesteremia   . Hypertension     There are no active problems to display for this patient.   History reviewed. No pertinent surgical history.      Home Medications    Prior to Admission medications   Not on File    Family History No  family history on file.  Social History Social History   Tobacco Use  . Smoking status: Never Smoker  . Smokeless tobacco: Never Used  Substance Use Topics  . Alcohol use: Not Currently  . Drug use: Never     Allergies   Patient has no known allergies.   Review of Systems Review of Systems  Eyes: Positive for visual disturbance.  Gastrointestinal: Positive for nausea.  Neurological: Positive for dizziness and weakness.  All other systems reviewed and are negative.    Physical Exam Updated Vital Signs BP (!) 208/105 (BP Location: Right Arm)   Pulse 77   Temp 98.9 F (37.2 C) (Oral)   Resp 18   SpO2 98%   Physical Exam Constitutional:      Appearance: He is well-developed.     Comments: NAD. Poor hygiene.   HENT:     Head: Normocephalic and atraumatic.     Comments: No facial, scalp tenderness.  Cerumen impaction bilaterally to ears.  No mastoid tenderness.    Right Ear: External ear normal.     Left Ear: External ear normal.     Nose: Congestion present.     Comments: Sounds congested.  Mild bilateral mucosal edema and erythema.  No rhinorrhea. Eyes:     Conjunctiva/sclera: Conjunctivae normal.  Neck:     Musculoskeletal: Normal range of motion.  Cardiovascular:  Rate and Rhythm: Normal rate and regular rhythm.     Heart sounds: Normal heart sounds.     Comments: 2+ DP and radial pulses bilaterally. No LE edema or calf tenderness.  Pulmonary:     Effort: Pulmonary effort is normal.     Breath sounds: Normal breath sounds.  Abdominal:     Palpations: Abdomen is soft.     Tenderness: There is no abdominal tenderness.  Musculoskeletal: Normal range of motion.  Skin:    General: Skin is warm and dry.     Capillary Refill: Capillary refill takes less than 2 seconds.  Neurological:     Gait: Gait abnormal.     Comments: Speech is fluent without obvious dysarthria or dysphasia. Strength 5/5 with hand grip and ankle F/E.   Sensation to light touch intact  in hands and feet. No truncal sway. No pronator drift. No leg drop.  Normal finger-to-nose, finger/hand/feet tapping, heel to shin bilaterally.  CN I, II and VIII not tested. CN II-XII grossly intact bilaterally.   Wide stance walking, slow. Unsteady with tandem walk. Swaying with Romberg test.  HiNTs Exam: Head impulse: saccade correction back midline noted. Nystagmus: bilateral horizontal more prominent to left. Test of skey: normal vertical eye alignment w/o vertical deviation.  Psychiatric:        Behavior: Behavior normal.      ED Treatments / Results  Labs (all labs ordered are listed, but only abnormal results are displayed) Labs Reviewed  CBC WITH DIFFERENTIAL/PLATELET - Abnormal; Notable for the following components:      Result Value   RBC 6.14 (*)    Hemoglobin 18.1 (*)    HCT 54.2 (*)    All other components within normal limits  BASIC METABOLIC PANEL - Abnormal; Notable for the following components:   CO2 20 (*)    Glucose, Bld 156 (*)    Anion gap 16 (*)    All other components within normal limits  CBG MONITORING, ED - Abnormal; Notable for the following components:   Glucose-Capillary 147 (*)    All other components within normal limits  I-STAT TROPONIN, ED    EKG EKG Interpretation  Date/Time:  Thursday January 19 2018 13:14:23 EST Ventricular Rate:  93 PR Interval:    QRS Duration: 117 QT Interval:  375 QTC Calculation: 467 R Axis:   -64 Text Interpretation:  Sinus rhythm LVH with IVCD, LAD and secondary repol abnrm agree, no old comparison Confirmed by Arby Barrette [id number]) on 01/19/2018 3:31:58 PM   Radiology Ct Head Wo Contrast  Result Date: 01/19/2018 CLINICAL DATA:  Lightheadedness and dizziness today. The patient suffered a fall 2 weeks ago with a blow to the back of the head. Initial encounter. EXAM: CT HEAD WITHOUT CONTRAST TECHNIQUE: Contiguous axial images were obtained from the base of the skull through the vertex without intravenous  contrast. COMPARISON:  None. FINDINGS: Brain: No evidence of acute infarction, hemorrhage, hydrocephalus, extra-axial collection or mass lesion/mass effect. There is some atrophy and chronic microvascular ischemic change. Remote right basal ganglia infarct noted. Vascular: No hyperdense vessel or unexpected calcification. Skull: Normal. Negative for fracture or focal lesion. Sinuses/Orbits: No acute abnormality. Scattered ethmoid air cell disease on the right and mild mucosal thickening in the maxillary sinuses noted. Other: None. IMPRESSION: No acute abnormality. Mild cortical atrophy and chronic microvascular ischemic change. Mild sinus disease. Electronically Signed   By: Drusilla Kanner M.D.   On: 01/19/2018 14:20    Procedures Procedures (including critical care  time)  Medications Ordered in ED Medications  sodium chloride 0.9 % bolus 1,000 mL (1,000 mLs Intravenous New Bag/Given 01/19/18 1504)  hydrALAZINE (APRESOLINE) injection 10 mg (10 mg Intravenous Given 01/19/18 1523)     Initial Impression / Assessment and Plan / ED Course  I have reviewed the triage vital signs and the nursing notes.  Pertinent labs & imaging results that were available during my care of the patient were reviewed by me and considered in my medical decision making (see chart for details).  Clinical Course as of Jan 20 1608  Thu Jan 19, 2018  1423 IMPRESSION: No acute abnormality.  Mild cortical atrophy and chronic microvascular ischemic change.  Mild sinus disease.  CT Head Wo Contrast [CG]    Clinical Course User Index [CG] Liberty Handy, PA-C    Ddx for dizziness includes peripheral vs central nervous system, cardiac etiology, dehydration.  Considered neck dissection given recent head trauma with intermittent paresthesias to left arm/leg, dizziness, double vision but he has no HA, neck pain, meningismus. On exam he has no cerebellar dysfunction but has slow, cautious, wide stance gait and  horizontal nystagmus. His Hints exam is reassuring for peripheral cause.  We will obtain screening labs, EKG, head CT. This could be post concussive syndrome.   1545: Pt remains hypertensive in setting of med non compliance. Creatinine normal.  EKG with LVH but no arrhythmias or ischemia. Hyperglycemia w/o DKA or HHS.  Electrolytes WNL.  Head CT shows old right basal ganglia infarct, chronic ischemic changes and sinus disease. We will order MRI to evaluate for infarct.   1605: Pending MRI.  Pt will be handed off to me by previous EDPA who will f/u on MRI and trend BP. Consider admission or neuro consult if abnormal MRI or elevated BP although I see no end organ damage on H&P or work up today to require emergent decrease of BP. Anticipate discharge with refill on HCTZ/losartan, metformin. Pt updated on pending MRI and hand off.  Final Clinical Impressions(s) / ED Diagnoses   Final diagnoses:  Dizziness  Recurrent falls  Elevated blood pressure reading with diagnosis of hypertension    ED Discharge Orders    None       Liberty Handy, PA-C 01/19/18 1609    Arby Barrette, MD 01/29/18 1558

## 2018-01-19 NOTE — ED Notes (Signed)
Patient transported to MRI 

## 2018-01-19 NOTE — ED Triage Notes (Signed)
[facility] for c/o dizziness, weakness and confusion intermittently. Symptoms have persisted for 1 week. He reports having multiple falls, one where he fell off the back of a truck due to a misstep and hit the back of his head. He reports the dizziness came after hitting his head. With EMS Stroke Screen negative.Has not taken any meds x1 year, [facility]  Swarm in progress.

## 2018-01-19 NOTE — ED Notes (Signed)
Neuro at bedside.

## 2018-01-20 ENCOUNTER — Other Ambulatory Visit: Payer: Self-pay

## 2018-01-20 ENCOUNTER — Inpatient Hospital Stay (HOSPITAL_COMMUNITY)

## 2018-01-20 LAB — COMPREHENSIVE METABOLIC PANEL
ALT: 13 U/L (ref 0–44)
AST: 16 U/L (ref 15–41)
Albumin: 3.4 g/dL — ABNORMAL LOW (ref 3.5–5.0)
Alkaline Phosphatase: 54 U/L (ref 38–126)
Anion gap: 13 (ref 5–15)
BUN: 10 mg/dL (ref 8–23)
CO2: 22 mmol/L (ref 22–32)
CREATININE: 0.91 mg/dL (ref 0.61–1.24)
Calcium: 8.8 mg/dL — ABNORMAL LOW (ref 8.9–10.3)
Chloride: 104 mmol/L (ref 98–111)
Glucose, Bld: 122 mg/dL — ABNORMAL HIGH (ref 70–99)
Potassium: 3.3 mmol/L — ABNORMAL LOW (ref 3.5–5.1)
Sodium: 139 mmol/L (ref 135–145)
Total Bilirubin: 1.2 mg/dL (ref 0.3–1.2)
Total Protein: 6.6 g/dL (ref 6.5–8.1)

## 2018-01-20 LAB — HIV ANTIBODY (ROUTINE TESTING W REFLEX): HIV Screen 4th Generation wRfx: NONREACTIVE

## 2018-01-20 LAB — HEMOGLOBIN A1C
Hgb A1c MFr Bld: 6.3 % — ABNORMAL HIGH (ref 4.8–5.6)
Mean Plasma Glucose: 134.11 mg/dL

## 2018-01-20 LAB — CBC
HCT: 46.4 % (ref 39.0–52.0)
Hemoglobin: 16.1 g/dL (ref 13.0–17.0)
MCH: 30.6 pg (ref 26.0–34.0)
MCHC: 34.7 g/dL (ref 30.0–36.0)
MCV: 88 fL (ref 80.0–100.0)
NRBC: 0 % (ref 0.0–0.2)
Platelets: 259 10*3/uL (ref 150–400)
RBC: 5.27 MIL/uL (ref 4.22–5.81)
RDW: 11.9 % (ref 11.5–15.5)
WBC: 6.2 10*3/uL (ref 4.0–10.5)

## 2018-01-20 LAB — GLUCOSE, CAPILLARY
GLUCOSE-CAPILLARY: 127 mg/dL — AB (ref 70–99)
Glucose-Capillary: 102 mg/dL — ABNORMAL HIGH (ref 70–99)
Glucose-Capillary: 143 mg/dL — ABNORMAL HIGH (ref 70–99)
Glucose-Capillary: 156 mg/dL — ABNORMAL HIGH (ref 70–99)

## 2018-01-20 LAB — LIPID PANEL
Cholesterol: 228 mg/dL — ABNORMAL HIGH (ref 0–200)
HDL: 16 mg/dL — ABNORMAL LOW (ref >40)
LDL Cholesterol: 190 mg/dL — ABNORMAL HIGH (ref 0–99)
TRIGLYCERIDES: 111 mg/dL (ref <150)
Total CHOL/HDL Ratio: 14.3 RATIO
VLDL: 22 mg/dL (ref 0–40)

## 2018-01-20 MED ORDER — POTASSIUM CHLORIDE CRYS ER 20 MEQ PO TBCR
20.0000 meq | EXTENDED_RELEASE_TABLET | Freq: Two times a day (BID) | ORAL | Status: AC
  Administered 2018-01-20 – 2018-01-21 (×2): 20 meq via ORAL
  Filled 2018-01-20 (×2): qty 1

## 2018-01-20 MED ORDER — ASPIRIN EC 81 MG PO TBEC
81.0000 mg | DELAYED_RELEASE_TABLET | Freq: Every day | ORAL | Status: DC
  Administered 2018-01-20 – 2018-01-23 (×4): 81 mg via ORAL
  Filled 2018-01-20 (×4): qty 1

## 2018-01-20 NOTE — Consult Note (Signed)
Physical Medicine and Rehabilitation Consult Reason for Consult: Decreased functional mobility with persistent ataxia Referring Physician: [facility]   HPI: Ricky Bright. is a 62 y.o.right handed male  with history of hypertension, hyperlipidemia, diabetes mellitus on no prescription medications. Per chart review patient lives alone. Independent prior to admission. Retired Hotel manager. One level home with 2 steps to entry. Son works maintenance at [employer]. No other local family. Presented 01/19/2018 with persistent ataxia. Patient states recent fall off a truck 3 weeks ago and has had balance difficulties since that time. Denied any swallowing, speech or visual deficits. MRI showed multiple areas of acute subacute infarction include the cerebellum bilaterally right occipital lobe, right thalamus and right caudate. CT angiogram of head and neck right vertebral artery occluded at the upper cervical region to the foramen magnum level. 80% or greater stenosis of the left V4 segment. Patient did not receive TPA. Echocardiogram pending. Neurology consulted with workup presently ongoing. Currently maintained on aspirin and Plavix for CVA prophylaxis. Subcutaneous Lovenox for DVT prophylaxis. Tolerating a regular consistency diet. Occupational therapy evaluation completed with recommendations of physical medicine rehabilitation consult.   Review of Systems  Constitutional: Negative for chills and fever.  HENT: Negative for hearing loss.   Eyes: Negative for blurred vision and double vision.  Respiratory: Negative for cough and shortness of breath.   Cardiovascular: Negative for chest pain, palpitations and leg swelling.  Gastrointestinal: Positive for constipation. Negative for nausea and vomiting.  Genitourinary: Negative for dysuria, flank pain and hematuria.  Musculoskeletal: Positive for falls.  Skin: Negative for rash.  Neurological: Positive for dizziness and focal weakness.    All other systems reviewed and are negative.  Past Medical History:  Diagnosis Date  . Diabetes mellitus without complication (HCC)   . HLD (hyperlipidemia)   . Hypercholesteremia   . Hypertension    Past Surgical History:  Procedure Laterality Date  . APPENDECTOMY    . bicep tendon rupture.     Family History  Problem Relation Age of Onset  . Stroke Father   . Stroke Paternal Grandfather    Social History:  reports that he has never smoked. He has never used smokeless tobacco. He reports previous alcohol use. He reports that he does not use drugs. Allergies: No Known Allergies No medications prior to admission.    Home: Home Living Family/patient expects to be discharged to:: Private residence Living Arrangements: Alone Available Help at Discharge: Family, Available PRN/intermittently Type of Home: House Home Access: Stairs to enter Secretary/administrator of Steps: 2 Home Layout: One level Bathroom Shower/Tub: Engineer, manufacturing systems: Standard Home Equipment: None Additional Comments: Pt has a cat and dog for whom his is reponsible   Functional History: Prior Function Level of Independence: Independent Comments: Pt was fully independent prior to fall/onset of symptoms.  Since that time, he has had multiple falls and has stopped driving due to feeling insecure.  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  requires assist to steady       ADL: ADL Overall ADL's : Needs assistance/impaired  Eating/Feeding:  Independent Grooming: Wash/dry hands, Wash/dry face, Oral care, Brushing hair, Minimal assistance, Standing Upper Body Bathing: Supervision/ safety, Set up, Sitting Lower Body Bathing: Minimal assistance, Sit to/from stand Upper Body Dressing : Set up, Sitting Lower Body Dressing: Moderate assistance, Sit to/from stand Lower Body Dressing Details (indicate cue type and reason): difficulty accessing feet for LB ADLs and due to balance  Toilet Transfer: Minimal assistance, Ambulation, Comfort height toilet Toileting- Clothing Manipulation and Hygiene: Minimal assistance, Sit to/from stand Functional mobility during ADLs: Minimal assistance, Rolling walker General ADL Comments: Pt requires assist for balance   Cognition: Cognition Overall Cognitive Status: Impaired/Different from baseline Orientation Level: Oriented X4 Cognition Arousal/Alertness: Awake/alert Behavior During Therapy: WFL for tasks assessed/performed Overall Cognitive Status: Impaired/Different from baseline Area of Impairment: Memory Memory: Decreased short-term memory General Comments: Pt noted to frequently repeat himself   Blood pressure (!) 167/98, pulse 84, temperature 98.4 F (36.9 C), temperature source Oral, resp. rate 18, height 6' (1.829 m), weight 97.7 kg, SpO2 99 %. Physical Exam  Neurological:  Patient is alert in no acute distress. Speech is fluent. Follows full commands. Fair awareness of deficits.    Results for orders placed or performed during the hospital encounter of 01/19/18 (from the past 24 hour(s))  I-Stat Troponin, ED (not at [facility])     Status: None   Collection Time: 01/19/18  1:42 PM  Result Value Ref Range   Troponin i, poc 0.02 0.00 - 0.08 ng/mL   Comment 3          HIV antibody (Routine Testing)     Status: None   Collection Time: 01/20/18  5:30 AM  Result Value Ref Range   HIV Screen 4th Generation wRfx Non Reactive Non Reactive  Hemoglobin A1c     Status: Abnormal   Collection  Time: 01/20/18  5:30 AM  Result Value Ref Range   Hgb A1c MFr Bld 6.3 (H) 4.8 - 5.6 %   Mean Plasma Glucose 134.11 mg/dL  Lipid panel     Status: Abnormal   Collection Time: 01/20/18  5:30 AM  Result Value Ref Range   Cholesterol 228 (H) 0 - 200 mg/dL   Triglycerides 161 <096 mg/dL   HDL 16 (L) >04 mg/dL   Total CHOL/HDL Ratio 14.3 RATIO   VLDL 22 0 - 40 mg/dL   LDL Cholesterol 540 (H) 0 - 99 mg/dL  Comprehensive metabolic panel     Status: Abnormal   Collection Time: 01/20/18  5:30 AM  Result Value Ref Range   Sodium 139 135 - 145 mmol/L   Potassium 3.3 (L) 3.5 - 5.1 mmol/L   Chloride 104 98 - 111 mmol/L   CO2 22 22 - 32 mmol/L   Glucose, Bld 122 (H) 70 - 99 mg/dL   BUN 10 8 - 23 mg/dL   Creatinine, Ser 9.81 0.61 - 1.24 mg/dL   Calcium 8.8 (L) 8.9 - 10.3 mg/dL   Total Protein 6.6 6.5 - 8.1 g/dL   Albumin 3.4 (L) 3.5 - 5.0 g/dL   AST 16 15 - 41 U/L   ALT 13 0 - 44 U/L   Alkaline Phosphatase 54 38 - 126 U/L   Total Bilirubin 1.2 0.3 - 1.2 mg/dL   GFR calc non Af Amer >60 >60 mL/min   GFR calc Af Amer >60 >60 mL/min   Anion gap 13 5 - 15  CBC     Status: None   Collection Time: 01/20/18  5:30 AM  Result  Value Ref Range   WBC 6.2 4.0 - 10.5 K/uL   RBC 5.27 4.22 - 5.81 MIL/uL   Hemoglobin 16.1 13.0 - 17.0 g/dL   HCT 16.1 09.6 - 04.5 %   MCV 88.0 80.0 - 100.0 fL   MCH 30.6 26.0 - 34.0 pg   MCHC 34.7 30.0 - 36.0 g/dL   RDW 40.9 81.1 - 91.4 %   Platelets 259 150 - 400 K/uL   nRBC 0.0 0.0 - 0.2 %  Glucose, capillary     Status: Abnormal   Collection Time: 01/20/18  7:06 AM  Result Value Ref Range   Glucose-Capillary 102 (H) 70 - 99 mg/dL  Glucose, capillary     Status: Abnormal   Collection Time: 01/20/18 12:03 PM  Result Value Ref Range   Glucose-Capillary 143 (H) 70 - 99 mg/dL   Comment 1 Notify RN    Comment 2 Document in Chart    Ct Angio Head W Or Wo Contrast  Result Date: 01/19/2018 CLINICAL DATA:  Larey Seat 2 weeks ago. Acute stroke presentation today with  multiple posterior circulation infarctions. EXAM: CT ANGIOGRAPHY HEAD AND NECK TECHNIQUE: Multidetector CT imaging of the head and neck was performed using the standard protocol during bolus administration of intravenous contrast. Multiplanar CT image reconstructions and MIPs were obtained to evaluate the vascular anatomy. Carotid stenosis measurements (when applicable) are obtained utilizing NASCET criteria, using the distal internal carotid diameter as the denominator. CONTRAST:  75mL ISOVUE-370 IOPAMIDOL (ISOVUE-370) INJECTION 76% COMPARISON:  CT and MRI same day. FINDINGS: CTA NECK FINDINGS Aortic arch: Aortic atherosclerosis. No dissection. Branching pattern is normal with the variation of the left vertebral artery arising directly from the arch. No origin stenosis. Right carotid system: Common carotid artery widely patent to the bifurcation. Soft and calcified plaque at the carotid bifurcation and ICA bulb but no stenosis. Cervical ICA is widely patent. Left carotid system: Common carotid artery widely patent to the bifurcation. There is soft and calcified plaque at the bifurcation and ICA bulb but no stenosis. Cervical ICA is widely patent. Vertebral arteries: Right vertebral artery origin shows 30% stenosis. Beyond that, the vessel shows diminishing flow, without opacification in the upper cervical region, consistent with recent occlusion. The left vertebral artery arises from the arch as mentioned before and is widely patent through the neck to the foramen magnum. Skeleton: Ordinary cervical spondylosis. Chronic fusion in the upper cervical region. Other neck: No soft tissue mass or lymphadenopathy. Vocal fold paresis on the right. Upper chest: Mild pleural and parenchymal scarring at the apices. Calcified granuloma posteriorly in the right upper lobe. Review of the MIP images confirms the above findings CTA HEAD FINDINGS Anterior circulation: Both internal carotid arteries are patent through the skull base  and siphon regions. There is atherosclerotic calcification in the carotid siphon regions. There is stenosis in the supraclinoid internal carotid artery on both sides, estimated at 70-80%. The anterior and middle cerebral vessels do show flow without more distal stenosis. Posterior circulation: As noted above, the right vertebral artery is occluded at the foramen magnum. This is probably a recent occlusion. Left vertebral artery is patent through the foramen magnum. There is severe stenosis of the V4 segment, 80% or greater, but the vessel does retain flow, supplying the basilar and giving retrograde flow in the distal right vertebral. There is a proximal basilar fenestration. No basilar stenosis. Superior cerebellar and posterior cerebral vessels show flow. Venous sinuses: Patent and normal. Anatomic variants: None other significant. Delayed phase: No  abnormal enhancement. Review of the MIP images confirms the above findings IMPRESSION: 1. Right vertebral artery occluded at the upper cervical region to foramen magnum level. This is probably a recent occlusion and explains the embolic disease within the posterior circulation branches. 2. 80% or greater stenosis of the left V4 segment. 3. Atherosclerotic disease at both carotid bifurcations but no stenosis. 4. Atherosclerotic disease in both carotid siphon regions. Stenosis in the supraclinoid internal carotid arteries estimated at 70-80% bilaterally. Electronically Signed   By: Paulina FusiMark  Shogry M.D.   On: 01/19/2018 19:31   Ct Head Wo Contrast  Result Date: 01/19/2018 CLINICAL DATA:  Lightheadedness and dizziness today. The patient suffered a fall 2 weeks ago with a blow to the back of the head. Initial encounter. EXAM: CT HEAD WITHOUT CONTRAST TECHNIQUE: Contiguous axial images were obtained from the base of the skull through the vertex without intravenous contrast. COMPARISON:  None. FINDINGS: Brain: No evidence of acute infarction, hemorrhage, hydrocephalus,  extra-axial collection or mass lesion/mass effect. There is some atrophy and chronic microvascular ischemic change. Remote right basal ganglia infarct noted. Vascular: No hyperdense vessel or unexpected calcification. Skull: Normal. Negative for fracture or focal lesion. Sinuses/Orbits: No acute abnormality. Scattered ethmoid air cell disease on the right and mild mucosal thickening in the maxillary sinuses noted. Other: None. IMPRESSION: No acute abnormality. Mild cortical atrophy and chronic microvascular ischemic change. Mild sinus disease. Electronically Signed   By: Drusilla Kannerhomas  Dalessio M.D.   On: 01/19/2018 14:20   Ct Angio Neck W And/or Wo Contrast  Result Date: 01/19/2018 CLINICAL DATA:  Larey SeatFell 2 weeks ago. Acute stroke presentation today with multiple posterior circulation infarctions. EXAM: CT ANGIOGRAPHY HEAD AND NECK TECHNIQUE: Multidetector CT imaging of the head and neck was performed using the standard protocol during bolus administration of intravenous contrast. Multiplanar CT image reconstructions and MIPs were obtained to evaluate the vascular anatomy. Carotid stenosis measurements (when applicable) are obtained utilizing NASCET criteria, using the distal internal carotid diameter as the denominator. CONTRAST:  75mL ISOVUE-370 IOPAMIDOL (ISOVUE-370) INJECTION 76% COMPARISON:  CT and MRI same day. FINDINGS: CTA NECK FINDINGS Aortic arch: Aortic atherosclerosis. No dissection. Branching pattern is normal with the variation of the left vertebral artery arising directly from the arch. No origin stenosis. Right carotid system: Common carotid artery widely patent to the bifurcation. Soft and calcified plaque at the carotid bifurcation and ICA bulb but no stenosis. Cervical ICA is widely patent. Left carotid system: Common carotid artery widely patent to the bifurcation. There is soft and calcified plaque at the bifurcation and ICA bulb but no stenosis. Cervical ICA is widely patent. Vertebral arteries:  Right vertebral artery origin shows 30% stenosis. Beyond that, the vessel shows diminishing flow, without opacification in the upper cervical region, consistent with recent occlusion. The left vertebral artery arises from the arch as mentioned before and is widely patent through the neck to the foramen magnum. Skeleton: Ordinary cervical spondylosis. Chronic fusion in the upper cervical region. Other neck: No soft tissue mass or lymphadenopathy. Vocal fold paresis on the right. Upper chest: Mild pleural and parenchymal scarring at the apices. Calcified granuloma posteriorly in the right upper lobe. Review of the MIP images confirms the above findings CTA HEAD FINDINGS Anterior circulation: Both internal carotid arteries are patent through the skull base and siphon regions. There is atherosclerotic calcification in the carotid siphon regions. There is stenosis in the supraclinoid internal carotid artery on both sides, estimated at 70-80%. The anterior and middle cerebral vessels do  show flow without more distal stenosis. Posterior circulation: As noted above, the right vertebral artery is occluded at the foramen magnum. This is probably a recent occlusion. Left vertebral artery is patent through the foramen magnum. There is severe stenosis of the V4 segment, 80% or greater, but the vessel does retain flow, supplying the basilar and giving retrograde flow in the distal right vertebral. There is a proximal basilar fenestration. No basilar stenosis. Superior cerebellar and posterior cerebral vessels show flow. Venous sinuses: Patent and normal. Anatomic variants: None other significant. Delayed phase: No abnormal enhancement. Review of the MIP images confirms the above findings IMPRESSION: 1. Right vertebral artery occluded at the upper cervical region to foramen magnum level. This is probably a recent occlusion and explains the embolic disease within the posterior circulation branches. 2. 80% or greater stenosis of the  left V4 segment. 3. Atherosclerotic disease at both carotid bifurcations but no stenosis. 4. Atherosclerotic disease in both carotid siphon regions. Stenosis in the supraclinoid internal carotid arteries estimated at 70-80% bilaterally. Electronically Signed   By: Paulina Fusi M.D.   On: 01/19/2018 19:31   [name] And Wo Contrast  Result Date: 01/19/2018 CLINICAL DATA:  Dizziness onset 1 week ago. Also diabetes hyperlipidemia hypertension history. Recent falls. EXAM: MRI HEAD WITHOUT AND WITH CONTRAST TECHNIQUE: Multiplanar, multiecho pulse sequences of the brain and surrounding structures were obtained without and with intravenous contrast. CONTRAST:  10 mL Gadovist IV COMPARISON:  CT head 01/19/2018 FINDINGS: Brain: Multiple areas of acute/subacute infarct with restricted diffusion. Several areas show patchy enhancement suggesting subacute duration. Patchy areas of restricted diffusion right cerebellum with enhancement. Restricted diffusion in the right occipital pole also with mild enhancement. Small area of restricted diffusion in the right thalamus. Small area of enhancement in the right caudate consistent with subacute infarct. Small area of restricted diffusion in the left cerebellum without enhancement. Chronic infarct in the deep white matter on the right. This shows evidence of prior mild hemorrhage. Mild atrophy. Negative for mass lesion. Vascular: Normal arterial flow voids Skull and upper cervical spine: Negative Sinuses/Orbits: Moderate mucosal edema paranasal sinuses. Normal orbit Other: None IMPRESSION: Multiple areas of acute/subacute infarct including the cerebellum bilaterally, right occipital lobe, right thalamus, and right caudate. Several of these show enhancement suggesting subacute duration. CTA head neck recommended for further vascular evaluation. Chronic hemorrhage in the deep white matter on the right. Mild atrophy. These results were called by telephone at the time of  interpretation on 01/19/2018 at 5:37 pm to [facility] PA , who verbally acknowledged these results. Electronically Signed   By: Marlan Palau M.D.   On: 01/19/2018 17:38     Assessment/Plan: Diagnosis: posterior circulation infarcts 1. Does the need for close, 24 hr/day medical supervision in concert with the patient's rehab needs make it unreasonable for this patient to be served in a less intensive setting? Yes 2. Co-Morbidities requiring supervision/potential complications:  HTN, diabetes 3. Due to bladder management, bowel management, safety, skin/wound care, disease management, medication administration, pain management and patient education, does the patient require 24 hr/day rehab nursing? Yes 4. Does the patient require coordinated care of a physician, rehab nurse, PT (1-2 hrs/day, 5 days/week) and OT (1-2 hrs/day, 5 days/week) to address physical and functional deficits in the context of the above medical diagnosis(es)? Yes Addressing deficits in the following areas: balance, endurance, locomotion, strength, transferring, bowel/bladder control, bathing, dressing, feeding, grooming, toileting and psychosocial support 5. Can the patient actively participate in an intensive  therapy program of at least 3 hrs of therapy per day at least 5 days per week? Yes 6. The potential for patient to make measurable gains while on inpatient rehab is good 7. Anticipated functional outcomes upon discharge from inpatient rehab are modified independent and supervision  with PT, modified independent and supervision with OT, n/a with SLP. 8. Estimated rehab length of stay to reach the above functional goals is: 10-17 days 9. Anticipated D/C setting: Home 10. Anticipated post D/C treatments: HH therapy 11. Overall Rehab/Functional Prognosis: excellent  RECOMMENDATIONS: This patient's condition is appropriate for continued rehabilitative care in the following setting: CIR Patient has agreed to participate  in recommended program. Yes Note that insurance prior authorization may be required for reimbursement for recommended care.  Comment: Rehab Admissions Coordinator to follow up.  Thanks,  [name], MD, [place] [facility]    Mcarthur Rossetti Angiulli, PA-C 01/20/2018

## 2018-01-20 NOTE — Evaluation (Signed)
Occupational Therapy Evaluation Patient Details Name: Ricky Bright. MRN: [id number] DOB: December 11, 1955 Today's Date: 01/20/2018    History of Present Illness This 62 y.o. male admitted with persistent ataxia  LEs.  as well as decreased balance .  MRI showed multiple areas of subacute and acute infarcts involving the cerebellum, Rt occipital and thalamic areas.  CTA showed occlusion of Rt vertebral artey.  PMH includes:  HTN, DM, hypelipidemia   Clinical Impression   Pt admitted with above. He demonstrates the below listed deficits and will benefit from continued OT to maximize safety and independence with BADLs.  Pt presents to OT with LE ataxia, mild nystagmus, dizziness, intermittent diplopia, and impaired memory.  He currently requires min guard - mod A for ADLs.  He lives alone and was fully independent until onset of symptoms.  He would benefit from CIR to allow him to maximize safety and dindependence with ADLs, reduce risk of falls/injury, and allow him to return home with supervision to mod I.        Follow Up Recommendations  CIR    Equipment Recommendations  Tub/shower bench    Recommendations for Other Services Rehab consult     Precautions / Restrictions Precautions Precautions: Fall      Mobility Bed Mobility Overal bed mobility: Needs Assistance Bed Mobility: Supine to Sit     Supine to sit: Supervision        Transfers Overall transfer level: Needs assistance Equipment used: Rolling walker (2 wheeled);1 person hand held assist Transfers: Sit to/from [employer] Sit to Stand: Min assist Stand pivot transfers: Min assist       General transfer comment: Pt with noted ataxia.  requires assist to steady     Balance Overall balance assessment: Needs assistance Sitting-balance support: Feet supported Sitting balance-Leahy Scale: Fair Sitting balance - Comments: close guarding to min A when leaning forward to don sock    Standing  balance support: No upper extremity supported Standing balance-Leahy Scale: Fair Standing balance comment: able to maintain static standing with close min guard assist                            ADL either performed or assessed with clinical judgement   ADL Overall ADL's : Needs assistance/impaired Eating/Feeding: Independent   Grooming: Wash/dry hands;Wash/dry face;Oral care;Brushing hair;Minimal assistance;Standing   Upper Body Bathing: Supervision/ safety;Set up;Sitting   Lower Body Bathing: Minimal assistance;Sit to/from stand   Upper Body Dressing : Set up;Sitting   Lower Body Dressing: Moderate assistance;Sit to/from stand Lower Body Dressing Details (indicate cue type and reason): difficulty accessing feet for LB ADLs and due to balance  Toilet Transfer: Minimal assistance;Ambulation;Comfort height toilet   Toileting- Clothing Manipulation and Hygiene: Minimal assistance;Sit to/from stand       Functional mobility during ADLs: Minimal assistance;Rolling walker General ADL Comments: Pt requires assist for balance      Vision Baseline Vision/History: Wears glasses Wears Glasses: Reading only Patient Visual Report: No change from baseline;Diplopia Vision Assessment?: Yes Eye Alignment: Impaired (comment) Ocular Range of Motion: Within Functional Limits Tracking/Visual Pursuits: Decreased smoothness of vertical tracking;Decreased smoothness of horizontal tracking Saccades: Other (comment);Additional eye shifts occurred during testing(occasional searching worse on the Lt ) Convergence: Impaired (comment) Visual Fields: No apparent deficits Additional Comments: Pt reports random vetical diplopia.  horizontal and mild rotational nystagmus noted with Lt gaze, and ? mild nystgmus to the Rt.  Convergence to ~14" from nose.  Gaze is mildly dysconjugate      Perception Perception Perception Tested?: Yes   Praxis Praxis Praxis tested?: Within functional limits     Pertinent Vitals/Pain Pain Assessment: Faces Faces Pain Scale: Hurts little more Pain Location: Low back (chronic) Pain Descriptors / Indicators: Aching Pain Intervention(s): Monitored during session     Hand Dominance Right   Extremity/Trunk Assessment Upper Extremity Assessment Upper Extremity Assessment: RUE deficits/detail RUE Deficits / Details: tremor noted which he reports is long stangind    Lower Extremity Assessment Lower Extremity Assessment: Defer to PT evaluation       Communication Communication Communication: No difficulties   Cognition Arousal/Alertness: Awake/alert Behavior During Therapy: WFL for tasks assessed/performed Overall Cognitive Status: Impaired/Different from baseline Area of Impairment: Memory                     Memory: Decreased short-term memory         General Comments: Pt noted to frequently repeat himself    General Comments       Exercises     Shoulder Instructions      Home Living Family/patient expects to be discharged to:: Private residence Living Arrangements: Alone Available Help at Discharge: Family;Available PRN/intermittently Type of Home: House Home Access: Stairs to enter [employer] of Steps: 2   Home Layout: One level     Bathroom Shower/Tub: Chief Strategy Officer: Standard     Home Equipment: None   Additional Comments: Pt has a cat and dog for whom his is reponsible       Prior Functioning/Environment Level of Independence: Independent        Comments: Pt was fully independent prior to fall/onset of symptoms.  Since that time, he has had multiple falls and has stopped driving due to feeling insecure.  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, agricultural prior to that         OT Problem List: Decreased activity tolerance;Impaired balance (sitting and/or standing);Impaired vision/perception;Decreased coordination;Decreased cognition;Decreased safety  awareness;Decreased knowledge of use of DME or AE      OT Treatment/Interventions: Self-care/ADL training;Neuromuscular education;Therapeutic activities;Cognitive remediation/compensation;Patient/family education;Balance training;Visual/perceptual remediation/compensation    OT Goals(Current goals can be found in the care plan section) Acute Rehab OT Goals Patient Stated Goal: to improve balance and be able to care for cat and dog  OT Goal Formulation: With patient Time For Goal Achievement: 02/03/18 Potential to Achieve Goals: Good ADL Goals Pt Will Perform Grooming: with supervision Pt Will Perform Lower Body Bathing: with supervision;sit to/from stand;with adaptive equipment Pt Will Perform Lower Body Dressing: with supervision;sit to/from stand;with adaptive equipment Pt Will Transfer to Toilet: with supervision;ambulating;regular height toilet Pt Will Perform Toileting - Clothing Manipulation and hygiene: with supervision;sit to/from stand Pt Will Perform Tub/Shower Transfer: Tub transfer;ambulating;shower seat;tub bench;rolling walker Additional ADL Goal #1: Pt will implement memory strategies with min cues  OT Frequency: Min 2X/week   Barriers to D/C:            Co-evaluation              AM-PAC OT "6 Clicks" Daily Activity     Outcome Measure Help from another person eating meals?: None Help from another person taking care of personal grooming?: A Little Help from another person toileting, which includes using toliet, bedpan, or urinal?: A Little Help from another person bathing (including washing, rinsing, drying)?: A Little Help from another person to put on and taking off regular  upper body clothing?: A Little Help from another person to put on and taking off regular lower body clothing?: A Lot 6 Click Score: 18   End of Session Equipment Utilized During Treatment: Gait belt;Rolling walker Nurse Communication: Mobility status  Activity Tolerance: Patient  tolerated treatment well Patient left: in chair;with call bell/phone within reach;with family/visitor present  OT Visit Diagnosis: Unsteadiness on feet (R26.81);Ataxia, unspecified (R27.0)                Time: 8657-84691116-1144 OT Time Calculation (min): 28 min Charges:  OT General Charges $OT Visit: 1 Visit OT Evaluation $OT Eval Moderate Complexity: 1 Mod OT Treatments $Self Care/Home Management : 8-22 mins  Jeani HawkingWendi Kenzel Ruesch, OTR/L Acute Rehabilitation Services Pager [phone] Office [phone]   Jeani HawkingConarpe, Neilani Duffee M 01/20/2018, 12:46 PM

## 2018-01-20 NOTE — Evaluation (Signed)
Physical Therapy Evaluation Patient Details Name: Ricky Bright. MRN: [id number] DOB: 01-12-1956 Today's Date: 01/20/2018   History of Present Illness  This 62 y.o. male admitted with persistent ataxia  LEs.  as well as decreased balance .  MRI showed multiple areas of subacute and acute infarcts involving the cerebellum, Rt occipital and thalamic areas.  CTA showed occlusion of Rt vertebral artey.  PMH includes:  HTN, DM, hypelipidemia  Clinical Impression  Pt admitted secondary to problem above with deficits below. Pt presenting with dizziness, ataxic gait, unsteadiness, and visual deficits. Pt requiring min to mod A during short distance gait for steadying assist with use of RW. Pt also complaining of consistent dizziness and double vision at end of session. Noted L beating nystagmus at end range of L horizontal gaze; pt also reporting increased dizziness. Prior to admission, pt was fully independent. Feel pt would be excellent candidate for CIR given current deficits, to increase independence and safety with functional mobility prior to return home. Will continue to follow acutely to maximize functional mobility independence and safety.     Follow Up Recommendations CIR;Supervision for mobility/OOB    Equipment Recommendations  None recommended by PT    Recommendations for Other Services OT consult;Rehab consult     Precautions / Restrictions Precautions Precautions: Fall Restrictions Weight Bearing Restrictions: No      Mobility  Bed Mobility Overal bed mobility: Needs Assistance Bed Mobility: Supine to Sit     Supine to sit: Min assist     General bed mobility comments: min A for trunk elevation. Increased time and effort required to come to sitting.   Transfers Overall transfer level: Needs assistance Equipment used: Rolling walker (2 wheeled);1 person hand held assist Transfers: Sit to/from Stand Sit to Stand: Min assist         General transfer comment:  Pt with noted ataxia.  requires assist to steady. Cues for safe hand placement.   Ambulation/Gait Ambulation/Gait assistance: Min assist;Mod assist Gait Distance (Feet): 15 Feet Assistive device: Rolling walker (2 wheeled) Gait Pattern/deviations: Step-through pattern;Decreased stride length;Ataxic Gait velocity: Decreased    General Gait Details: Ataxic gait and pt tended to run into obstacles, especially on his R side with use of RW. Min A for steadying throughout and pt reporting increased dizziness with increased distance. Also reported some double vision that occured at end of gait training. Reports vision difficulty fluctuates. One LOB noted when turning and required mod A for steadying.   Stairs            Wheelchair Mobility    Modified Rankin (Stroke Patients Only) Modified Rankin (Stroke Patients Only) Pre-Morbid Rankin Score: No symptoms Modified Rankin: Moderately severe disability     Balance Overall balance assessment: Needs assistance Sitting-balance support: Feet supported Sitting balance-Leahy Scale: Fair     Standing balance support: Bilateral upper extremity supported;During functional activity Standing balance-Leahy Scale: Poor Standing balance comment: Reliant on BUE support                              Pertinent Vitals/Pain Pain Assessment: Faces Faces Pain Scale: Hurts a little bit Pain Location: Low back (chronic) Pain Descriptors / Indicators: Aching Pain Intervention(s): Limited activity within patient's tolerance;Monitored during session;Repositioned    Home Living Family/patient expects to be discharged to:: Private residence Living Arrangements: Alone Available Help at Discharge: Family;Available PRN/intermittently Type of Home: House Home Access: Stairs to enter   Entrance  Stairs-Number of Steps: 2 Home Layout: One level Home Equipment: None Additional Comments: Pt has a cat and dog for whom he is reponsible     Prior  Function Level of Independence: Independent         Comments: Pt was fully independent prior to fall/onset of symptoms.  Since that time, he has had multiple falls and has stopped driving due to feeling insecure.  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, agriculturalavy Seal prior to that      [employer]inesHand Dominance   Dominant Hand: Right    Extremity/Trunk Assessment   Upper Extremity Assessment Upper Extremity Assessment: Defer to OT evaluation    Lower Extremity Assessment Lower Extremity Assessment: LLE deficits/detail;RLE deficits/detail RLE Coordination: decreased gross motor LLE Coordination: decreased gross motor    Cervical / Trunk Assessment Cervical / Trunk Assessment: Normal  Communication   Communication: No difficulties  Cognition Arousal/Alertness: Awake/alert Behavior During Therapy: WFL for tasks assessed/performed Overall Cognitive Status: Impaired/Different from baseline Area of Impairment: Memory                     Memory: Decreased short-term memory         General Comments: Pt noted to frequently repeat himself       General Comments General comments (skin integrity, edema, etc.): Noted L beating nystagmus at end range of L horizontal gaze. Pt reported increased dizziness at that time.     Exercises     Assessment/Plan    PT Assessment Patient needs continued PT services  PT Problem List Decreased strength;Decreased balance;Decreased mobility;Decreased cognition;Decreased knowledge of use of DME;Decreased knowledge of precautions       PT Treatment Interventions DME instruction;Gait training;Functional mobility training;Therapeutic activities;Stair training;Therapeutic exercise;Balance training;Patient/family education;Neuromuscular re-education    PT Goals (Current goals can be found in the Care Plan section)  Acute Rehab PT Goals Patient Stated Goal: to improve balance and be able to care for cat and dog  PT Goal Formulation: With  patient Time For Goal Achievement: 02/03/18 Potential to Achieve Goals: Good    Frequency Min 4X/week   Barriers to discharge        Co-evaluation               AM-PAC PT "6 Clicks" Mobility  Outcome Measure Help needed turning from your back to your side while in a flat bed without using bedrails?: A Little Help needed moving from lying on your back to sitting on the side of a flat bed without using bedrails?: A Little Help needed moving to and from a bed to a chair (including a wheelchair)?: A Little Help needed standing up from a chair using your arms (e.g., wheelchair or bedside chair)?: A Little Help needed to walk in hospital room?: A Lot Help needed climbing 3-5 steps with a railing? : A Lot 6 Click Score: 16    End of Session Equipment Utilized During Treatment: Gait belt Activity Tolerance: Treatment limited secondary to medical complications (Comment)(dizziness ) Patient left: in chair;with call bell/phone within reach Nurse Communication: Mobility status PT Visit Diagnosis: Unsteadiness on feet (R26.81);Muscle weakness (generalized) (M62.81);Dizziness and giddiness (R42);Ataxic gait (R26.0)    Time: [id number] PT Time Calculation (min) (ACUTE ONLY): 13 min   Charges:   PT Evaluation $PT Eval Moderate Complexity: 1 Mod          Gladys DammeBrittany Amariah Kierstead, PT, DPT  Acute Rehabilitation Services  Pager: [phone] Office: [phone]   Lehman PromBrittany S Iveliz Garay  01/20/2018, 4:31 PM

## 2018-01-20 NOTE — Progress Notes (Signed)
STROKE TEAM PROGRESS NOTE   HISTORY OF PRESENT ILLNESS (per record) Ricky Romberg. is a 62 y.o. male with a history of diabetes, hypertension, hypercholesterolemia who presents with dizziness for the past week.  He states that he was unloading a moving truck when he fell and hit his head.  Immediately after this he was dizzy.  He thinks he probably just missed a step, though he states that it is possible that he became dizzy acutely and that is why he fell, he is not certain.  Due to persistent symptoms he sought care in the emergency department today where an MRI shows multifocal posterior circulation infarcts with an occluded vertebral artery.  He also endorses some numbness of the left side that seems to come and go.   SUBJECTIVE (INTERVAL HISTORY) Family members and therapist present. The patient is having balance problems with gait difficulties. He admits to ocassional diplopia. The pt also endorses sleep apnea. Dr Pearlean Brownie spoke to him about the sleep apnea study and the pt expressed interest. Apparently he had been unsuccessful in attempts to be tested at the [facility].    OBJECTIVE Vitals:   01/20/18 0402 01/20/18 0803 01/20/18 1229 01/20/18 1553  BP: 134/67 (!) 158/78 (!) 167/98 (!) 197/100  Pulse: 64 (!) 59 84 68  Resp: 17 15 18 17   Temp:  98.4 F (36.9 C) 98.4 F (36.9 C) 98.4 F (36.9 C)  TempSrc:  Oral Oral Oral  SpO2: 97% 99% 99% 99%  Weight:      Height:        CBC:  Recent Labs  Lab 01/19/18 1316 01/20/18 0530  WBC 8.1 6.2  NEUTROABS 5.5  --   HGB 18.1* 16.1  HCT 54.2* 46.4  MCV 88.3 88.0  PLT 310 259    Basic Metabolic Panel:  Recent Labs  Lab 01/19/18 1316 01/20/18 0530  NA 136 139  K 3.5 3.3*  CL 100 104  CO2 20* 22  GLUCOSE 156* 122*  BUN 13 10  CREATININE 1.14 0.91  CALCIUM 9.6 8.8*    Lipid Panel:     Component Value Date/Time   CHOL 228 (H) 01/20/2018 0530   TRIG 111 01/20/2018 0530   HDL 16 (L) 01/20/2018 0530   CHOLHDL  14.3 01/20/2018 0530   VLDL 22 01/20/2018 0530   LDLCALC 190 (H) 01/20/2018 0530   HgbA1c:  Lab Results  Component Value Date   HGBA1C 6.3 (H) 01/20/2018   Urine Drug Screen: No results found for: LABOPIA, COCAINSCRNUR, LABBENZ, AMPHETMU, THCU, LABBARB  Alcohol Level No results found for: ETH  IMAGING   Ct Angio Head W Or Wo Contrast Ct Angio Neck W And/or Wo Contrast 01/19/2018 IMPRESSION:  1. Right vertebral artery occluded at the upper cervical region to foramen magnum level. This is probably a recent occlusion and explains the embolic disease within the posterior circulation branches.  2. 80% or greater stenosis of the left V4 segment.  3. Atherosclerotic disease at both carotid bifurcations but no stenosis.  4. Atherosclerotic disease in both carotid siphon regions. Stenosis in the supraclinoid internal carotid arteries estimated at 70-80% bilaterally.   Ct Head Wo Contrast 01/19/2018 IMPRESSION:  No acute abnormality. Mild cortical atrophy and chronic microvascular ischemic change. Mild sinus disease.    Mr Ricky Bright And Wo Contrast 01/19/2018 IMPRESSION:  Multiple areas of acute/subacute infarct including the cerebellum bilaterally, right occipital lobe, right thalamus, and right caudate. Several of these show enhancement suggesting subacute duration. CTA head  neck recommended for further vascular evaluation. Chronic hemorrhage in the deep white matter on the right. Mild atrophy.     Transthoracic Echocardiogram  00/00/00 Pending      PHYSICAL EXAM Blood pressure (!) 197/100, pulse 68, temperature 98.4 F (36.9 C), temperature source Oral, resp. rate 17, height 6' (1.829 m), weight 97.7 kg, SpO2 99 %.    Obese middle-age Caucasian male not in distress. . Afebrile. Head is nontraumatic. Neck is supple without bruit.    Cardiac exam no murmur or gallop. Lungs are clear to auscultation. Distal pulses are well felt. Neurological Exam ;  Awake  Alert oriented x 3.  Normal speech and language.eye movements full without nystagmus but saccadic dysmetria on bilateral lateral gaze..fundi were not visualized. Vision acuity and fields appear normal. Hearing is normal. Palatal movements are normal. Face symmetric. Tongue midline. Normal strength, tone, reflexes and coordination except mild finger-to-nose dysmetria on the left.. Normal sensation. Gait broad based slightly ataxic.   ASSESSMENT/PLAN [name] is a 62 y.o. male with history of DM, Hld, and Htn presenting with dizziness and Lt sided numbness after falling. He did not receive IV t-PA due to late presentation.  Stroke: multifocal posterior circulation infarcts - occluded Rt vertebral artery.  Resultant - balance problems  CT head - No acute abnormality.  MRI head - Multiple areas of acute/subacute infarct including the cerebellum bilaterally, right occipital lobe, right thalamus, and right caudate  MRA head - not performed  CTA H&N - Right vertebral artery occluded. Stenosis in the supraclinoid internal carotid arteries estimated at 70-80% bilaterally.   Carotid Doppler - CTA neck performed - carotid dopplers not indicated.  2D Echo - pending  LDL - 190  HgbA1c - 6.3  UDS - not performed  VTE prophylaxis - Lovenox  Diet  - Carb modified with thin liquids  No antithrombotic prior to admission, now on aspirin 81 mg daily and clopidogrel 75 mg daily  Patient counseled to be compliant with his antithrombotic medications  Ongoing aggressive stroke risk factor management  Therapy recommendations:  CIR recommended - consult requested  Disposition:  Pending  Hypertension  BP occassionally high but within parameters . Permissive hypertension (OK if < 220/120) but gradually normalize in 5-7 days . Long-term BP goal normotensive  Hyperlipidemia  Lipid lowering medication PTA:  none  LDL 190, goal < 70  Current lipid lowering medication: Lipitor 80 mg  daily  Continue statin at discharge  Diabetes  HgbA1c 6.3, goal < 7.0  Controlled  Other Stroke Risk Factors  Advanced age   ETOH use, advised to drink no more than 1 alcoholic beverage per day.  Obesity, Body mass index is 29.21 kg/m., recommend weight loss, diet and exercise as appropriate   Hx stroke/TIA  Family hx stroke (father and grandfather)   Other Active Problems  Hypokalemia 3.3 - supplement  Plan  ASA and Plavix for 3 months then ASA 81 mg daily indefinatly.   Hospital day # 1  Delton See PA-C [facility] Pager [phone] 01/20/2018, 4:34 PM I have personally obtained history,examined this patient, reviewed notes, independently viewed imaging studies, participated in medical decision making and plan of care.ROS completed by me personally and pertinent positives fully documented  I have made any additions or clarifications directly to the above note. Agree with note above. He presented with dizziness secondary to multiple posterior circulation infarcts from severe occlusive bilateral vertebral artery disease. Recommend dual antiplatelet therapy of aspirin and Plavix  for 3 months followed by aspirin alone. Continue ongoing stroke workup and aggressive risk factor modification., discussion with patient and family and answered questions. Patient has high risk for having sleep apnea and is interested in participating in this sleep smart stroke study. He was given information to review and decide.but after doing so he decided he did not want to participate. Greater than 50% time during this 35 minute visit was spent on counseling and coordination of care about his strokes and discussion about treatment options and stroke risk factors and prevention and answered questions. Discussed with Dr. Carmel Sacramentoostin  Maitland Muhlbauer, MD Medical Director [facility] Pager: [phone] 01/20/2018 5:41 PM   To contact Stroke Continuity provider, please  refer to [email or web address]. After hours, contact General Neurology

## 2018-01-20 NOTE — Progress Notes (Signed)
PROGRESS NOTE  Ricky Bright. ZOX:[id number] DOB: [birth year] DOA: 01/19/2018 PCP: Bing Neighbors, [name]   LOS: 1 day   Brief Narrative / Interim history: 62 year old male history of hypertension, diabetes mellitus type 2, hyperlipidemia, who ran out of his medications and tells me he has never received them from his [place] and has not taken any for the past several months, presents to the hospital on 12/12 for persistent ataxia on walking.  He also has had a fall about 3 weeks ago.  Imaging on admission was positive for acute stroke.  Subjective: -Still complains of dizziness this morning, denies any chest pain.  He tells me he has been feeling intermittent palpitations like a bird in his chest on and off for several months  Assessment & Plan: Active Problems:   Acute CVA (cerebrovascular accident) (HCC)   Hypertensive urgency   Type 2 diabetes mellitus with vascular disease (HCC)   Hyperlipidemia   CVA (cerebral vascular accident) (HCC)   Principal Problem Acute CVA -MRI on admission showed multiple areas of acute/subacute infarct including the cerebellum bilaterally, right occipital lobe, right thalamus and right caudate, some of these being subacute.  MRI was followed by CT angiogram of the head and neck which showed right vertebral artery occluded at the upper cervical region to foramen magnum level, appearing to be a recent occlusion, 80% or greater stenosis of the left V4 segment and atherosclerotic disease at both carotid bifurcations with no stenosis. -Complete stroke work-up, appreciate stroke team input, continue aspirin and Plavix for now, continue statin, 2D echo pending  Additional Problems Type 2 diabetes mellitus, controlled -Most recent A1c done this morning was 6.3, showing control.  Continue sliding scale while hospitalized  Hyperlipidemia -Total cholesterol is 228, LDL is 119.  Continue high-dose statin.  Hypertension -For now allow permissive  hypertension  Scheduled Meds: . aspirin EC  81 mg Oral Daily  . atorvastatin  80 mg Oral q1800  . clopidogrel  75 mg Oral Daily  . enoxaparin (LOVENOX) injection  40 mg Subcutaneous Q24H  . insulin aspart  0-9 Units Subcutaneous TID WC   Continuous Infusions: . sodium chloride 100 mL/hr at 01/19/18 2213   PRN Meds:.acetaminophen **OR** acetaminophen (TYLENOL) oral liquid 160 mg/5 mL **OR** acetaminophen, senna-docusate  DVT prophylaxis: Lovenox Code Status: Full code Family Communication: No family present at bedside Disposition Plan: CIR evaluating patient  Consultants:   Neurology  Procedures:   2D echo: Pending  Antimicrobials:  None   Objective: Vitals:   01/19/18 2344 01/20/18 0402 01/20/18 0803 01/20/18 1229  BP: (!) 148/75 134/67 (!) 158/78 (!) 167/98  Pulse: 75 64 (!) 59 84  Resp:  17 15 18   Temp:   98.4 F (36.9 C) 98.4 F (36.9 C)  TempSrc:   Oral Oral  SpO2: 98% 97% 99% 99%  Weight:      Height:        Intake/Output Summary (Last 24 hours) at 01/20/2018 1338 Last data filed at 01/20/2018 1243 Gross per 24 hour  Intake 1200 ml  Output 700 ml  Net 500 ml   Filed Weights   01/19/18 2132  Weight: 97.7 kg    Examination:  Constitutional: NAD Eyes: lids and conjunctivae normal ENMT: Mucous membranes are moist.  Neck: normal, supple Respiratory: clear to auscultation bilaterally, no wheezing, no crackles. Normal respiratory effort.  Cardiovascular: Regular rate and rhythm, no murmurs / rubs / gallops. No LE edema Abdomen: no tenderness. Bowel sounds positive.  Musculoskeletal: no clubbing /  cyanosis. Skin: no rashes Neurologic: CN 2-12 grossly intact. Strength 5/5 in all 4.  Psychiatric: Normal judgment and insight. Alert and oriented x 3. Normal mood.    Data Reviewed: I have independently reviewed following labs and imaging studies   CBC: Recent Labs  Lab 01/19/18 1316 01/20/18 0530  WBC 8.1 6.2  NEUTROABS 5.5  --   HGB 18.1*  16.1  HCT 54.2* 46.4  MCV 88.3 88.0  PLT 310 259   Basic Metabolic Panel: Recent Labs  Lab 01/19/18 1316 01/20/18 0530  NA 136 139  K 3.5 3.3*  CL 100 104  CO2 20* 22  GLUCOSE 156* 122*  BUN 13 10  CREATININE 1.14 0.91  CALCIUM 9.6 8.8*   GFR: Estimated Creatinine Clearance: 99.3 mL/min (by C-G formula based on SCr of 0.91 mg/dL). Liver Function Tests: Recent Labs  Lab 01/20/18 0530  AST 16  ALT 13  ALKPHOS 54  BILITOT 1.2  PROT 6.6  ALBUMIN 3.4*   No results for input(s): LIPASE, AMYLASE in the last 168 hours. No results for input(s): AMMONIA in the last 168 hours. Coagulation Profile: No results for input(s): INR, PROTIME in the last 168 hours. Cardiac Enzymes: No results for input(s): CKTOTAL, CKMB, CKMBINDEX, TROPONINI in the last 168 hours. BNP (last 3 results) No results for input(s): PROBNP in the last 8760 hours. HbA1C: Recent Labs    01/20/18 0530  HGBA1C 6.3*   CBG: Recent Labs  Lab 01/19/18 1310 01/20/18 0706 01/20/18 1203  GLUCAP 147* 102* 143*   Lipid Profile: Recent Labs    01/20/18 0530  CHOL 228*  HDL 16*  LDLCALC 190*  TRIG 111  CHOLHDL 14.3   Thyroid Function Tests: No results for input(s): TSH, T4TOTAL, FREET4, T3FREE, THYROIDAB in the last 72 hours. Anemia Panel: No results for input(s): VITAMINB12, FOLATE, FERRITIN, TIBC, IRON, RETICCTPCT in the last 72 hours. Urine analysis: No results found for: COLORURINE, APPEARANCEUR, LABSPEC, PHURINE, GLUCOSEU, HGBUR, BILIRUBINUR, KETONESUR, PROTEINUR, UROBILINOGEN, NITRITE, LEUKOCYTESUR Sepsis Labs: Invalid input(s): PROCALCITONIN, LACTICIDVEN  No results found for this or any previous visit (from the past 240 hour(s)).    Radiology Studies: Ct Angio Head W Or Wo Contrast  Result Date: 01/19/2018 CLINICAL DATA:  Larey Seat 2 weeks ago. Acute stroke presentation today with multiple posterior circulation infarctions. EXAM: CT ANGIOGRAPHY HEAD AND NECK TECHNIQUE: Multidetector CT  imaging of the head and neck was performed using the standard protocol during bolus administration of intravenous contrast. Multiplanar CT image reconstructions and MIPs were obtained to evaluate the vascular anatomy. Carotid stenosis measurements (when applicable) are obtained utilizing NASCET criteria, using the distal internal carotid diameter as the denominator. CONTRAST:  75mL ISOVUE-370 IOPAMIDOL (ISOVUE-370) INJECTION 76% COMPARISON:  CT and MRI same day. FINDINGS: CTA NECK FINDINGS Aortic arch: Aortic atherosclerosis. No dissection. Branching pattern is normal with the variation of the left vertebral artery arising directly from the arch. No origin stenosis. Right carotid system: Common carotid artery widely patent to the bifurcation. Soft and calcified plaque at the carotid bifurcation and ICA bulb but no stenosis. Cervical ICA is widely patent. Left carotid system: Common carotid artery widely patent to the bifurcation. There is soft and calcified plaque at the bifurcation and ICA bulb but no stenosis. Cervical ICA is widely patent. Vertebral arteries: Right vertebral artery origin shows 30% stenosis. Beyond that, the vessel shows diminishing flow, without opacification in the upper cervical region, consistent with recent occlusion. The left vertebral artery arises from the arch as mentioned before and  is widely patent through the neck to the foramen magnum. Skeleton: Ordinary cervical spondylosis. Chronic fusion in the upper cervical region. Other neck: No soft tissue mass or lymphadenopathy. Vocal fold paresis on the right. Upper chest: Mild pleural and parenchymal scarring at the apices. Calcified granuloma posteriorly in the right upper lobe. Review of the MIP images confirms the above findings CTA HEAD FINDINGS Anterior circulation: Both internal carotid arteries are patent through the skull base and siphon regions. There is atherosclerotic calcification in the carotid siphon regions. There is  stenosis in the supraclinoid internal carotid artery on both sides, estimated at 70-80%. The anterior and middle cerebral vessels do show flow without more distal stenosis. Posterior circulation: As noted above, the right vertebral artery is occluded at the foramen magnum. This is probably a recent occlusion. Left vertebral artery is patent through the foramen magnum. There is severe stenosis of the V4 segment, 80% or greater, but the vessel does retain flow, supplying the basilar and giving retrograde flow in the distal right vertebral. There is a proximal basilar fenestration. No basilar stenosis. Superior cerebellar and posterior cerebral vessels show flow. Venous sinuses: Patent and normal. Anatomic variants: None other significant. Delayed phase: No abnormal enhancement. Review of the MIP images confirms the above findings IMPRESSION: 1. Right vertebral artery occluded at the upper cervical region to foramen magnum level. This is probably a recent occlusion and explains the embolic disease within the posterior circulation branches. 2. 80% or greater stenosis of the left V4 segment. 3. Atherosclerotic disease at both carotid bifurcations but no stenosis. 4. Atherosclerotic disease in both carotid siphon regions. Stenosis in the supraclinoid internal carotid arteries estimated at 70-80% bilaterally. Electronically Signed   By: Paulina Fusi M.D.   On: 01/19/2018 19:31   Ct Head Wo Contrast  Result Date: 01/19/2018 CLINICAL DATA:  Lightheadedness and dizziness today. The patient suffered a fall 2 weeks ago with a blow to the back of the head. Initial encounter. EXAM: CT HEAD WITHOUT CONTRAST TECHNIQUE: Contiguous axial images were obtained from the base of the skull through the vertex without intravenous contrast. COMPARISON:  None. FINDINGS: Brain: No evidence of acute infarction, hemorrhage, hydrocephalus, extra-axial collection or mass lesion/mass effect. There is some atrophy and chronic microvascular  ischemic change. Remote right basal ganglia infarct noted. Vascular: No hyperdense vessel or unexpected calcification. Skull: Normal. Negative for fracture or focal lesion. Sinuses/Orbits: No acute abnormality. Scattered ethmoid air cell disease on the right and mild mucosal thickening in the maxillary sinuses noted. Other: None. IMPRESSION: No acute abnormality. Mild cortical atrophy and chronic microvascular ischemic change. Mild sinus disease. Electronically Signed   By: Drusilla Kanner M.D.   On: 01/19/2018 14:20   Ct Angio Neck W And/or Wo Contrast  Result Date: 01/19/2018 CLINICAL DATA:  Larey Seat 2 weeks ago. Acute stroke presentation today with multiple posterior circulation infarctions. EXAM: CT ANGIOGRAPHY HEAD AND NECK TECHNIQUE: Multidetector CT imaging of the head and neck was performed using the standard protocol during bolus administration of intravenous contrast. Multiplanar CT image reconstructions and MIPs were obtained to evaluate the vascular anatomy. Carotid stenosis measurements (when applicable) are obtained utilizing NASCET criteria, using the distal internal carotid diameter as the denominator. CONTRAST:  75mL ISOVUE-370 IOPAMIDOL (ISOVUE-370) INJECTION 76% COMPARISON:  CT and MRI same day. FINDINGS: CTA NECK FINDINGS Aortic arch: Aortic atherosclerosis. No dissection. Branching pattern is normal with the variation of the left vertebral artery arising directly from the arch. No origin stenosis. Right carotid system: Common carotid  artery widely patent to the bifurcation. Soft and calcified plaque at the carotid bifurcation and ICA bulb but no stenosis. Cervical ICA is widely patent. Left carotid system: Common carotid artery widely patent to the bifurcation. There is soft and calcified plaque at the bifurcation and ICA bulb but no stenosis. Cervical ICA is widely patent. Vertebral arteries: Right vertebral artery origin shows 30% stenosis. Beyond that, the vessel shows diminishing flow,  without opacification in the upper cervical region, consistent with recent occlusion. The left vertebral artery arises from the arch as mentioned before and is widely patent through the neck to the foramen magnum. Skeleton: Ordinary cervical spondylosis. Chronic fusion in the upper cervical region. Other neck: No soft tissue mass or lymphadenopathy. Vocal fold paresis on the right. Upper chest: Mild pleural and parenchymal scarring at the apices. Calcified granuloma posteriorly in the right upper lobe. Review of the MIP images confirms the above findings CTA HEAD FINDINGS Anterior circulation: Both internal carotid arteries are patent through the skull base and siphon regions. There is atherosclerotic calcification in the carotid siphon regions. There is stenosis in the supraclinoid internal carotid artery on both sides, estimated at 70-80%. The anterior and middle cerebral vessels do show flow without more distal stenosis. Posterior circulation: As noted above, the right vertebral artery is occluded at the foramen magnum. This is probably a recent occlusion. Left vertebral artery is patent through the foramen magnum. There is severe stenosis of the V4 segment, 80% or greater, but the vessel does retain flow, supplying the basilar and giving retrograde flow in the distal right vertebral. There is a proximal basilar fenestration. No basilar stenosis. Superior cerebellar and posterior cerebral vessels show flow. Venous sinuses: Patent and normal. Anatomic variants: None other significant. Delayed phase: No abnormal enhancement. Review of the MIP images confirms the above findings IMPRESSION: 1. Right vertebral artery occluded at the upper cervical region to foramen magnum level. This is probably a recent occlusion and explains the embolic disease within the posterior circulation branches. 2. 80% or greater stenosis of the left V4 segment. 3. Atherosclerotic disease at both carotid bifurcations but no stenosis. 4.  Atherosclerotic disease in both carotid siphon regions. Stenosis in the supraclinoid internal carotid arteries estimated at 70-80% bilaterally. Electronically Signed   By: Paulina Fusi M.D.   On: 01/19/2018 19:31   [name] And Wo Contrast  Result Date: 01/19/2018 CLINICAL DATA:  Dizziness onset 1 week ago. Also diabetes hyperlipidemia hypertension history. Recent falls. EXAM: MRI HEAD WITHOUT AND WITH CONTRAST TECHNIQUE: Multiplanar, multiecho pulse sequences of the brain and surrounding structures were obtained without and with intravenous contrast. CONTRAST:  10 mL Gadovist IV COMPARISON:  CT head 01/19/2018 FINDINGS: Brain: Multiple areas of acute/subacute infarct with restricted diffusion. Several areas show patchy enhancement suggesting subacute duration. Patchy areas of restricted diffusion right cerebellum with enhancement. Restricted diffusion in the right occipital pole also with mild enhancement. Small area of restricted diffusion in the right thalamus. Small area of enhancement in the right caudate consistent with subacute infarct. Small area of restricted diffusion in the left cerebellum without enhancement. Chronic infarct in the deep white matter on the right. This shows evidence of prior mild hemorrhage. Mild atrophy. Negative for mass lesion. Vascular: Normal arterial flow voids Skull and upper cervical spine: Negative Sinuses/Orbits: Moderate mucosal edema paranasal sinuses. Normal orbit Other: None IMPRESSION: Multiple areas of acute/subacute infarct including the cerebellum bilaterally, right occipital lobe, right thalamus, and right caudate. Several of these show enhancement suggesting subacute  duration. CTA head neck recommended for further vascular evaluation. Chronic hemorrhage in the deep white matter on the right. Mild atrophy. These results were called by telephone at the time of interpretation on 01/19/2018 at 5:37 pm to [facility] Harris PA , who verbally acknowledged these results.  Electronically Signed   By: Marlan Palauharles  Clark M.D.   On: 01/19/2018 17:38    Pamella Pertostin Reade Trefz, MD, PhD [facility] Pager [phone]  If 7PM-7AM, please contact night-coverage [email or web address] Password [email or web address] 01/20/2018, 1:38 PM

## 2018-01-20 NOTE — Progress Notes (Signed)
Rehab Admissions Coordinator Note:  Patient was screened by Clois DupesBoyette, Tina Gruner Godwin for appropriateness for an Inpatient Acute Rehab Consult per OT recs.  At this time, we are recommending Inpatient Rehab consult.  Ottie GlazierBarbara Degan Hanser, RN, MSN Rehab Admissions Coordinator [phone] 01/20/2018 1:35 PM

## 2018-01-20 NOTE — Progress Notes (Signed)
Inpatient Rehabilitation Admissions Coordinator  I will follow up with patient and family their preferences for rehab venue. I can then begin insurance authorization with [employer] for a possible inpt rehab admission.  Ottie GlazierBarbara Vitaly Wanat, RN, MSN Rehab Admissions Coordinator [phone] 01/20/2018 8:12 PM

## 2018-01-20 NOTE — Progress Notes (Signed)
SLP Cancellation Note  Patient Details Name: Ricky RombergWilliam Ray Santiago Jr. MRN: [id number] DOB: July 10, 1955   Cancelled treatment:       Reason Eval/Treat Not Completed: SLP screened, no needs identified, will sign off   Fani Rotondo, Riley NearingBonnie Caroline 01/20/2018, 3:54 PM

## 2018-01-21 ENCOUNTER — Inpatient Hospital Stay (HOSPITAL_COMMUNITY)

## 2018-01-21 DIAGNOSIS — I6389 Other cerebral infarction: Secondary | ICD-10-CM

## 2018-01-21 DIAGNOSIS — I63443 Cerebral infarction due to embolism of bilateral cerebellar arteries: Secondary | ICD-10-CM

## 2018-01-21 LAB — GLUCOSE, CAPILLARY
Glucose-Capillary: 121 mg/dL — ABNORMAL HIGH (ref 70–99)
Glucose-Capillary: 147 mg/dL — ABNORMAL HIGH (ref 70–99)
Glucose-Capillary: 140 mg/dL — ABNORMAL HIGH (ref 70–99)
Glucose-Capillary: 115 mg/dL — ABNORMAL HIGH (ref 70–99)

## 2018-01-21 LAB — CBC
HCT: 46.1 % (ref 39.0–52.0)
HEMOGLOBIN: 15.4 g/dL (ref 13.0–17.0)
MCH: 29.8 pg (ref 26.0–34.0)
MCHC: 33.4 g/dL (ref 30.0–36.0)
MCV: 89.3 fL (ref 80.0–100.0)
Platelets: 254 10*3/uL (ref 150–400)
RBC: 5.16 MIL/uL (ref 4.22–5.81)
RDW: 11.9 % (ref 11.5–15.5)
WBC: 6.3 10*3/uL (ref 4.0–10.5)
nRBC: 0 % (ref 0.0–0.2)

## 2018-01-21 LAB — ECHOCARDIOGRAM COMPLETE
Height: 72 in
Weight: 3446.23 oz

## 2018-01-21 LAB — BASIC METABOLIC PANEL
Anion gap: 12 (ref 5–15)
BUN: 6 mg/dL — ABNORMAL LOW (ref 8–23)
CO2: 22 mmol/L (ref 22–32)
Calcium: 8.8 mg/dL — ABNORMAL LOW (ref 8.9–10.3)
Chloride: 104 mmol/L (ref 98–111)
Creatinine, Ser: 1.01 mg/dL (ref 0.61–1.24)
GFR calc non Af Amer: >60 mL/min (ref >60)
Glucose, Bld: 124 mg/dL — ABNORMAL HIGH (ref 70–99)
Potassium: 3.6 mmol/L (ref 3.5–5.1)
Sodium: 138 mmol/L (ref 135–145)

## 2018-01-21 MED ORDER — AMLODIPINE BESYLATE 10 MG PO TABS
10.0000 mg | ORAL_TABLET | Freq: Every day | ORAL | Status: DC
  Administered 2018-01-21 – 2018-01-23 (×3): 10 mg via ORAL
  Filled 2018-01-21 (×3): qty 1

## 2018-01-21 MED ORDER — LISINOPRIL 10 MG PO TABS
10.0000 mg | ORAL_TABLET | Freq: Two times a day (BID) | ORAL | Status: DC
  Administered 2018-01-21 – 2018-01-23 (×4): 10 mg via ORAL
  Filled 2018-01-21 (×4): qty 1

## 2018-01-21 NOTE — Progress Notes (Signed)
MD notified of Pt's BP of 197/102, MD called back to continue to monitor pt, that the BP has to be above 220 for systolic to get BP medication. Will continue to monitor.

## 2018-01-21 NOTE — Progress Notes (Signed)
Physical Therapy Treatment Patient Details Name: Ricky Bright. MRN: [id number] DOB: 09-23-1955 Today's Date: 01/21/2018    History of Present Illness This 62 y.o. male admitted with persistent ataxia  LEs.  as well as decreased balance .  MRI showed multiple areas of subacute and acute infarcts involving the cerebellum, Rt occipital and thalamic areas.  CTA showed occlusion of Rt vertebral artey.  PMH includes:  HTN, DM, hypelipidemia    PT Comments    Pt making steady progress with functional mobility, tolerating ambulating a further distance and requiring less physical assistance than previous session. Pt would continue to benefit from further intensive therapy services prior to returning home. Pt would continue to benefit from skilled physical therapy services at this time while admitted and after d/c to address the below listed limitations in order to improve overall safety and independence with functional mobility.  BP sitting at beginning of session =  180/100 mmHg BP sitting at end of session = 184/122 mmHg   Follow Up Recommendations  CIR;Supervision for mobility/OOB     Equipment Recommendations  None recommended by PT    Recommendations for Other Services       Precautions / Restrictions Precautions Precautions: Fall Restrictions Weight Bearing Restrictions: No    Mobility  Bed Mobility Overal bed mobility: Modified Independent                Transfers Overall transfer level: Needs assistance Equipment used: Rolling walker (2 wheeled) Transfers: Sit to/from Stand Sit to Stand: Min guard         General transfer comment: min guard for safety, good technique utilized  Ambulation/Gait Ambulation/Gait assistance: Editor, commissioning (Feet): 50 Feet Assistive device: Rolling walker (2 wheeled) Gait Pattern/deviations: Step-through pattern;Decreased stride length;Drifts right/left Gait velocity: Decreased    General Gait Details: pt with  modest instability throughout requiring constant min A for safety and for balance; pt with reported "lightheadedness" that persisted throughout but was no worse or better with ambulation   Stairs             Wheelchair Mobility    Modified Rankin (Stroke Patients Only) Modified Rankin (Stroke Patients Only) Pre-Morbid Rankin Score: No symptoms Modified Rankin: Moderately severe disability     Balance Overall balance assessment: Needs assistance Sitting-balance support: Feet supported Sitting balance-Leahy Scale: Good     Standing balance support: Bilateral upper extremity supported;During functional activity Standing balance-Leahy Scale: Poor Standing balance comment: Reliant on BUE support                             Cognition Arousal/Alertness: Awake/alert Behavior During Therapy: WFL for tasks assessed/performed Overall Cognitive Status: Within Functional Limits for tasks assessed                                        Exercises      General Comments        Pertinent Vitals/Pain Pain Assessment: No/denies pain    Home Living                      Prior Function            PT Goals (current goals can now be found in the care plan section) Acute Rehab PT Goals PT Goal Formulation: With patient Time For Goal Achievement: 02/03/18 Potential  to Achieve Goals: Good Progress towards PT goals: Progressing toward goals    Frequency    Min 4X/week      PT Plan Current plan remains appropriate    Co-evaluation              AM-PAC PT "6 Clicks" Mobility   Outcome Measure  Help needed turning from your back to your side while in a flat bed without using bedrails?: None Help needed moving from lying on your back to sitting on the side of a flat bed without using bedrails?: None Help needed moving to and from a bed to a chair (including a wheelchair)?: A Little Help needed standing up from a chair using your  arms (e.g., wheelchair or bedside chair)?: A Little Help needed to walk in hospital room?: A Little Help needed climbing 3-5 steps with a railing? : A Lot 6 Click Score: 19    End of Session Equipment Utilized During Treatment: Gait belt Activity Tolerance: Patient limited by fatigue Patient left: in bed;with call bell/phone within reach Nurse Communication: Mobility status PT Visit Diagnosis: Other abnormalities of gait and mobility (R26.89);Muscle weakness (generalized) (M62.81)     Time: [id number] PT Time Calculation (min) (ACUTE ONLY): 17 min  Charges:  $Gait Training: 8-22 mins                     Deborah ChalkJennifer Darrah Dredge, [place]PT, DPT  Acute Rehabilitation Services Pager [phone] Office [phone]     Alessandra BevelsJennifer M Clema Skousen 01/21/2018, 4:26 PM

## 2018-01-21 NOTE — Progress Notes (Signed)
  Echocardiogram 2D Echocardiogram has been performed.  Gerda Dissrthur L Paolina Karwowski 01/21/2018, 10:51 AM

## 2018-01-21 NOTE — Progress Notes (Signed)
STROKE TEAM PROGRESS NOTE   SUBJECTIVE (INTERVAL HISTORY) No family is present at bedside.  Patient sitting in bed, still complains of mild dizziness on head motion, but able to walk with PT/OT.  Stated that he had a fall 3 weeks ago, hit his head hard.  He also stated that he had intermittent episode and night with heart palpitation and lasting 1 to 2 minutes.   OBJECTIVE Vitals:   01/20/18 2340 01/21/18 0347 01/21/18 0743 01/21/18 1135  BP: (!) 158/80 (!) 197/102 (!) 184/101 (!) 200/101  Pulse: (!) 56 60 60 66  Resp: 17 17 16 16   Temp: 98.4 F (36.9 C) 98.5 F (36.9 C) 97.9 F (36.6 C) 98.7 F (37.1 C)  TempSrc: Oral Oral Oral Oral  SpO2: 100% 97% 98% 92%  Weight:      Height:        CBC:  Recent Labs  Lab 01/19/18 1316 01/20/18 0530 01/21/18 0420  WBC 8.1 6.2 6.3  NEUTROABS 5.5  --   --   HGB 18.1* 16.1 15.4  HCT 54.2* 46.4 46.1  MCV 88.3 88.0 89.3  PLT 310 259 254    Basic Metabolic Panel:  Recent Labs  Lab 01/20/18 0530 01/21/18 0420  NA 139 138  K 3.3* 3.6  CL 104 104  CO2 22 22  GLUCOSE 122* 124*  BUN 10 6*  CREATININE 0.91 1.01  CALCIUM 8.8* 8.8*    Lipid Panel:     Component Value Date/Time   CHOL 228 (H) 01/20/2018 0530   TRIG 111 01/20/2018 0530   HDL 16 (L) 01/20/2018 0530   CHOLHDL 14.3 01/20/2018 0530   VLDL 22 01/20/2018 0530   LDLCALC 190 (H) 01/20/2018 0530   HgbA1c:  Lab Results  Component Value Date   HGBA1C 6.3 (H) 01/20/2018   Urine Drug Screen: No results found for: LABOPIA, COCAINSCRNUR, LABBENZ, AMPHETMU, THCU, LABBARB  Alcohol Level No results found for: ETH  IMAGING  Ct Angio Head W Or Wo Contrast Ct Angio Neck W And/or Wo Contrast 01/19/2018 IMPRESSION:  1. Right vertebral artery occluded at the upper cervical region to foramen magnum level. This is probably a recent occlusion and explains the embolic disease within the posterior circulation branches.  2. 80% or greater stenosis of the left V4 segment.  3.  Atherosclerotic disease at both carotid bifurcations but no stenosis.  4. Atherosclerotic disease in both carotid siphon regions. Stenosis in the supraclinoid internal carotid arteries estimated at 70-80% bilaterally.   Ct Head Wo Contrast 01/19/2018 IMPRESSION:  No acute abnormality. Mild cortical atrophy and chronic microvascular ischemic change. Mild sinus disease.    Mr Laqueta JeanBrain W And Wo Contrast 01/19/2018 IMPRESSION:  Multiple areas of acute/subacute infarct including the cerebellum bilaterally, right occipital lobe, right thalamus, and right caudate. Several of these show enhancement suggesting subacute duration. CTA head neck recommended for further vascular evaluation. Chronic hemorrhage in the deep white matter on the right. Mild atrophy.    Transthoracic Echocardiogram  01/21/2018 Study Conclusions - Left ventricle: The cavity size was normal. Wall thickness was   increased in a pattern of mild LVH. Systolic function was normal.   Although no diagnostic regional wall motion abnormality was   identified, this possibility cannot be completely excluded on the   basis of this study. Doppler parameters are consistent with   abnormal left ventricular relaxation (grade 1 diastolic   dysfunction). - Ventricular septum: Abnormal septal motion likely secondary to   conduction delay. - Aortic valve: Trileaflet;  mildly thickened leaflets.   Transvalvular velocity was within the normal range. There was no   stenosis. There was trivial regurgitation. Mean gradient (S): 5   mm Hg. Valve area (VTI): 3.29 cm^2. Valve area (Vmax): 2.67 cm^2.   Valve area (Vmean): 2.7 cm^2. - Aortic root: The aortic root was dilated, measuring 49 mm. - Ascending aorta: The ascending aorta was dilated, measuring 43 mm   in the proximal segment. - Mitral valve: Mildly calcified annulus. Chordal calcification.   Transvalvular velocity was within the normal range. There was no   evidence for stenosis. There was  trivial regurgitation. - Left atrium: The atrium was normal in size. - Right ventricle: The cavity size was normal. Wall thickness was   normal. Systolic function was normal. RV systolic pressure (S,   est): 14 mm Hg. - Right atrium: Central venous pressure (est): 8 mm Hg. - Tricuspid valve: There was trivial regurgitation. - Inferior vena cava: The vessel was normal in size. The   respirophasic diameter changes were blunted (< 50%). - Pericardium, extracardiac: There was no pericardial effusion. Impressions: - No cardiac source of embolism was identified, but cannot be ruled   out on the basis of this examination due to image quality. Recommendations:  Recommend CT angiogram of the aorta (ECG gated or flash scanned) to evaluate dilated ascending aorta.   PHYSICAL EXAM Blood pressure (!) 200/101, pulse 66, temperature 98.7 F (37.1 C), temperature source Oral, resp. rate 16, height 6' (1.829 m), weight 97.7 kg, SpO2 92 %. Middle-age Caucasian male not in distress. . Afebrile. Head is nontraumatic. Neck is supple without bruit.    Cardiac exam no murmur or gallop. Lungs are clear to auscultation. Distal pulses are well felt. Neurological Exam ;  Awake  Alert oriented x 3. Normal speech and language. eye movements full without nystagmus. fundi were not visualized. Vision acuity and fields appear normal. Hearing is normal. Palatal movements are normal. Face symmetric. Tongue midline. Normal strength, tone, reflexes and coordination with finger-to-nose bilaterally. Normal sensation. Gait not tested.    ASSESSMENT/PLAN [name] is a 62 y.o. male with history of DM, HLD, and HTN presenting with dizziness and Lt sided numbness after falling. He did not receive IV t-PA due to late presentation.  Stroke: multifocal posterior circulation infarcts involving bilateral cerebellum PICA distribution and the right PCA including right occipital and thalamus - etiology uncertain, could be  related to the fall 3 weeks ago with vertebrals dissection versus chronic vertebral artery stenosis/occlusion, versus undiagnosed A. fib due to intermittent heart palpitation  Resultant mild dizziness with head motion  CT head - No acute abnormality.  MRI head - Multiple areas of acute/subacute infarct including the cerebellum bilaterally, right occipital lobe, right thalamus  CTA H&N - Right vertebral artery occluded.  Left V4  > 80% stenosis.  Stenosis in the supraclinoid internal carotid arteries estimated at 70-80% bilaterally.   2D Echo - normal EF no cardiac source of embolism was identified  Recommend TEE and loop recorder on Monday to rule out cardioembolic source  LDL - 190  HgbA1c - 6.3  VTE prophylaxis - Lovenox  Diet  - Carb modified with thin liquids  No antithrombotic prior to admission, now on aspirin 81 mg daily and clopidogrel 75 mg daily.  Continue DAPT for 3 weeks and then aspirin alone.  Patient counseled to be compliant with his antithrombotic medications  Ongoing aggressive stroke risk factor management  Therapy recommendations:  CIR recommended -  consult requested  Disposition:  Pending  Intracranial stenosis  CTA head and neck showed right VA occlusion, left V4 more than 80% stenosis bilateral ICA siphon 70 to 80% stenosis bilateral carotid bulb atherosclerosis.  ??  Right VA occlusion due to dissection after fall  On DAPT for 3 weeks and then aspirin alone  Heart palpitation  Patient stated intermittent heart palpitation at night lasting 1 to 2 minutes  Recommend TEE and loop recorder to rule out cardioembolic source  Hypertension  BP occassionally high but within parameters . gradually normalize in 2-3 days . Start amlodipine 10 daily and lisinopril 10 twice daily . Long-term BP goal normotensive  Hyperlipidemia  Lipid lowering medication PTA:  none  LDL 190, goal < 70  Current lipid lowering medication: Lipitor 80 mg  daily  Continue statin at discharge  Diabetes  HgbA1c 6.3, goal < 7.0  Controlled  SSI  CBG monitoring  Other Stroke Risk Factors  Advanced age  ETOH use, advised to drink no more than 1 alcoholic beverage per day.  Hx stroke/TIA by imaging  Family hx stroke (father and grandfather)   Other Active Problems  Hypokalemia 3.3 - supplement -> 3.6   Marvel Plan, MD PhD Stroke Neurology 01/21/2018 5:44 PM   To contact Stroke Continuity provider, please refer to [email or web address]. After hours, contact General Neurology

## 2018-01-21 NOTE — Progress Notes (Signed)
PROGRESS NOTE  Ricky RombergWilliam Ray Poet Jr. ZOX:[id number] DOB: 1955-09-21 DOA: 01/19/2018 PCP: Bing NeighborsHarris, Kimberly S, [name]   LOS: 2 days   Brief Narrative / Interim history: 62 year old male history of hypertension, diabetes mellitus type 2, hyperlipidemia, who ran out of his medications and tells me [place] and has not [place] any for the past several months, presents to the hospital on 12/12 for persistent ataxia on walking.  He also has had a fall about 3 weeks ago.  Imaging on admission was positive for acute stroke.  Subjective: -Dizziness is persistent.  No chest pain, no palpitations overnight.  No shortness of breath  Assessment & Plan: Active Problems:   Acute CVA (cerebrovascular accident) (HCC)   Hypertensive urgency   Type 2 diabetes mellitus with vascular disease (HCC)   Hyperlipidemia   CVA (cerebral vascular accident) (HCC)   Principal Problem Acute CVA -MRI on admission showed multiple areas of acute/subacute infarct including the cerebellum bilaterally, right occipital lobe, right thalamus and right caudate, some of these being subacute.  MRI was followed by CT angiogram of the head and neck which showed right vertebral artery occluded at the upper cervical region to foramen magnum level, appearing to be a recent occlusion, 80% or greater stenosis of the left V4 segment and atherosclerotic disease at both carotid bifurcations with no stenosis. -Complete stroke work-up, appreciate stroke team input, continue aspirin and Plavix for now, continue statin -2D echo still pending this morning  Additional Problems Type 2 diabetes mellitus, controlled -Most recent A1c 6.3, showing control.  Continue sliding scale while hospitalized  Hyperlipidemia -Total cholesterol is 228, LDL is 119.  Continue high-dose statin.  Hypertension -For now allow permissive hypertension  Scheduled Meds: . aspirin EC  81 mg Oral Daily  . atorvastatin  80 mg Oral q1800  .  clopidogrel  75 mg Oral Daily  . enoxaparin (LOVENOX) injection  40 mg Subcutaneous Q24H  . insulin aspart  0-9 Units Subcutaneous TID WC   Continuous Infusions:  PRN Meds:.acetaminophen **OR** acetaminophen (TYLENOL) oral liquid 160 mg/5 mL **OR** acetaminophen, senna-docusate  DVT prophylaxis: Lovenox Code Status: Full code Family Communication: No family present at bedside Disposition Plan: CIR evaluating patient  Consultants:   Neurology  Procedures:   2D echo: Pending  Antimicrobials:  None   Objective: Vitals:   01/20/18 1947 01/20/18 2340 01/21/18 0347 01/21/18 0743  BP: (!) 172/82 (!) 158/80 (!) 197/102 (!) 184/101  Pulse: (!) 59 (!) 56 60 60  Resp: 17 17 17 16   Temp: 98.6 F (37 C) 98.4 F (36.9 C) 98.5 F (36.9 C) 97.9 F (36.6 C)  TempSrc: Oral Oral Oral Oral  SpO2: 100% 100% 97% 98%  Weight:      Height:        Intake/Output Summary (Last 24 hours) at 01/21/2018 1045 Last data filed at 01/21/2018 0950 Gross per 24 hour  Intake 720 ml  Output 1850 ml  Net -1130 ml   Filed Weights   01/19/18 2132  Weight: 97.7 kg    Examination:  Constitutional: NAD Respiratory: clear to auscultation bilaterally, no wheezing, no crackles. Normal respiratory effort.  Cardiovascular: Regular rate and rhythm, no murmurs / rubs / gallops. No LE edema Abdomen: no tenderness. Bowel sounds positive.  Neurologic: No focal deficits  Data Reviewed: I have independently reviewed following labs and imaging studies   CBC: Recent Labs  Lab 01/19/18 1316 01/20/18 0530 01/21/18 0420  WBC 8.1 6.2 6.3  NEUTROABS  5.5  --   --   HGB 18.1* 16.1 15.4  HCT 54.2* 46.4 46.1  MCV 88.3 88.0 89.3  PLT 310 259 254   Basic Metabolic Panel: Recent Labs  Lab 01/19/18 1316 01/20/18 0530 01/21/18 0420  NA 136 139 138  K 3.5 3.3* 3.6  CL 100 104 104  CO2 20* 22 22  GLUCOSE 156* 122* 124*  BUN 13 10 6*  CREATININE 1.14 0.91 1.01  CALCIUM 9.6 8.8* 8.8*    GFR: Estimated Creatinine Clearance: 89.5 mL/min (by C-G formula based on SCr of 1.01 mg/dL). Liver Function Tests: Recent Labs  Lab 01/20/18 0530  AST 16  ALT 13  ALKPHOS 54  BILITOT 1.2  PROT 6.6  ALBUMIN 3.4*   No results for input(s): LIPASE, AMYLASE in the last 168 hours. No results for input(s): AMMONIA in the last 168 hours. Coagulation Profile: No results for input(s): INR, PROTIME in the last 168 hours. Cardiac Enzymes: No results for input(s): CKTOTAL, CKMB, CKMBINDEX, TROPONINI in the last 168 hours. BNP (last 3 results) No results for input(s): PROBNP in the last 8760 hours. HbA1C: Recent Labs    01/20/18 0530  HGBA1C 6.3*   CBG: Recent Labs  Lab 01/20/18 0706 01/20/18 1203 01/20/18 1756 01/20/18 2111 01/21/18 0633  GLUCAP 102* 143* 156* 127* 121*   Lipid Profile: Recent Labs    01/20/18 0530  CHOL 228*  HDL 16*  LDLCALC 190*  TRIG 111  CHOLHDL 14.3   Thyroid Function Tests: No results for input(s): TSH, T4TOTAL, FREET4, T3FREE, THYROIDAB in the last 72 hours. Anemia Panel: No results for input(s): VITAMINB12, FOLATE, FERRITIN, TIBC, IRON, RETICCTPCT in the last 72 hours. Urine analysis: No results found for: COLORURINE, APPEARANCEUR, LABSPEC, PHURINE, GLUCOSEU, HGBUR, BILIRUBINUR, KETONESUR, PROTEINUR, UROBILINOGEN, NITRITE, LEUKOCYTESUR Sepsis Labs: Invalid input(s): PROCALCITONIN, LACTICIDVEN  No results found for this or any previous visit (from the past 240 hour(s)).    Radiology Studies: Ct Angio Head W Or Wo Contrast  Result Date: 01/19/2018 CLINICAL DATA:  Larey Seat 2 weeks ago. Acute stroke presentation today with multiple posterior circulation infarctions. EXAM: CT ANGIOGRAPHY HEAD AND NECK TECHNIQUE: Multidetector CT imaging of the head and neck was performed using the standard protocol during bolus administration of intravenous contrast. Multiplanar CT image reconstructions and MIPs were obtained to evaluate the vascular anatomy.  Carotid stenosis measurements (when applicable) are obtained utilizing NASCET criteria, using the distal internal carotid diameter as the denominator. CONTRAST:  75mL ISOVUE-370 IOPAMIDOL (ISOVUE-370) INJECTION 76% COMPARISON:  CT and MRI same day. FINDINGS: CTA NECK FINDINGS Aortic arch: Aortic atherosclerosis. No dissection. Branching pattern is normal with the variation of the left vertebral artery arising directly from the arch. No origin stenosis. Right carotid system: Common carotid artery widely patent to the bifurcation. Soft and calcified plaque at the carotid bifurcation and ICA bulb but no stenosis. Cervical ICA is widely patent. Left carotid system: Common carotid artery widely patent to the bifurcation. There is soft and calcified plaque at the bifurcation and ICA bulb but no stenosis. Cervical ICA is widely patent. Vertebral arteries: Right vertebral artery origin shows 30% stenosis. Beyond that, the vessel shows diminishing flow, without opacification in the upper cervical region, consistent with recent occlusion. The left vertebral artery arises from the arch as mentioned before and is widely patent through the neck to the foramen magnum. Skeleton: Ordinary cervical spondylosis. Chronic fusion in the upper cervical region. Other neck: No soft tissue mass or lymphadenopathy. Vocal fold paresis on the right.  Upper chest: Mild pleural and parenchymal scarring at the apices. Calcified granuloma posteriorly in the right upper lobe. Review of the MIP images confirms the above findings CTA HEAD FINDINGS Anterior circulation: Both internal carotid arteries are patent through the skull base and siphon regions. There is atherosclerotic calcification in the carotid siphon regions. There is stenosis in the supraclinoid internal carotid artery on both sides, estimated at 70-80%. The anterior and middle cerebral vessels do show flow without more distal stenosis. Posterior circulation: As noted above, the right  vertebral artery is occluded at the foramen magnum. This is probably a recent occlusion. Left vertebral artery is patent through the foramen magnum. There is severe stenosis of the V4 segment, 80% or greater, but the vessel does retain flow, supplying the basilar and giving retrograde flow in the distal right vertebral. There is a proximal basilar fenestration. No basilar stenosis. Superior cerebellar and posterior cerebral vessels show flow. Venous sinuses: Patent and normal. Anatomic variants: None other significant. Delayed phase: No abnormal enhancement. Review of the MIP images confirms the above findings IMPRESSION: 1. Right vertebral artery occluded at the upper cervical region to foramen magnum level. This is probably a recent occlusion and explains the embolic disease within the posterior circulation branches. 2. 80% or greater stenosis of the left V4 segment. 3. Atherosclerotic disease at both carotid bifurcations but no stenosis. 4. Atherosclerotic disease in both carotid siphon regions. Stenosis in the supraclinoid internal carotid arteries estimated at 70-80% bilaterally. Electronically Signed   By: Paulina Fusi M.D.   On: 01/19/2018 19:31   Ct Head Wo Contrast  Result Date: 01/19/2018 CLINICAL DATA:  Lightheadedness and dizziness today. The patient suffered a fall 2 weeks ago with a blow to the back of the head. Initial encounter. EXAM: CT HEAD WITHOUT CONTRAST TECHNIQUE: Contiguous axial images were obtained from the base of the skull through the vertex without intravenous contrast. COMPARISON:  None. FINDINGS: Brain: No evidence of acute infarction, hemorrhage, hydrocephalus, extra-axial collection or mass lesion/mass effect. There is some atrophy and chronic microvascular ischemic change. Remote right basal ganglia infarct noted. Vascular: No hyperdense vessel or unexpected calcification. Skull: Normal. Negative for fracture or focal lesion. Sinuses/Orbits: No acute abnormality. Scattered  ethmoid air cell disease on the right and mild mucosal thickening in the maxillary sinuses noted. Other: None. IMPRESSION: No acute abnormality. Mild cortical atrophy and chronic microvascular ischemic change. Mild sinus disease. Electronically Signed   By: Drusilla Kanner M.D.   On: 01/19/2018 14:20   Ct Angio Neck W And/or Wo Contrast  Result Date: 01/19/2018 CLINICAL DATA:  Larey Seat 2 weeks ago. Acute stroke presentation today with multiple posterior circulation infarctions. EXAM: CT ANGIOGRAPHY HEAD AND NECK TECHNIQUE: Multidetector CT imaging of the head and neck was performed using the standard protocol during bolus administration of intravenous contrast. Multiplanar CT image reconstructions and MIPs were obtained to evaluate the vascular anatomy. Carotid stenosis measurements (when applicable) are obtained utilizing NASCET criteria, using the distal internal carotid diameter as the denominator. CONTRAST:  75mL ISOVUE-370 IOPAMIDOL (ISOVUE-370) INJECTION 76% COMPARISON:  CT and MRI same day. FINDINGS: CTA NECK FINDINGS Aortic arch: Aortic atherosclerosis. No dissection. Branching pattern is normal with the variation of the left vertebral artery arising directly from the arch. No origin stenosis. Right carotid system: Common carotid artery widely patent to the bifurcation. Soft and calcified plaque at the carotid bifurcation and ICA bulb but no stenosis. Cervical ICA is widely patent. Left carotid system: Common carotid artery widely patent to the  bifurcation. There is soft and calcified plaque at the bifurcation and ICA bulb but no stenosis. Cervical ICA is widely patent. Vertebral arteries: Right vertebral artery origin shows 30% stenosis. Beyond that, the vessel shows diminishing flow, without opacification in the upper cervical region, consistent with recent occlusion. The left vertebral artery arises from the arch as mentioned before and is widely patent through the neck to the foramen magnum. Skeleton:  Ordinary cervical spondylosis. Chronic fusion in the upper cervical region. Other neck: No soft tissue mass or lymphadenopathy. Vocal fold paresis on the right. Upper chest: Mild pleural and parenchymal scarring at the apices. Calcified granuloma posteriorly in the right upper lobe. Review of the MIP images confirms the above findings CTA HEAD FINDINGS Anterior circulation: Both internal carotid arteries are patent through the skull base and siphon regions. There is atherosclerotic calcification in the carotid siphon regions. There is stenosis in the supraclinoid internal carotid artery on both sides, estimated at 70-80%. The anterior and middle cerebral vessels do show flow without more distal stenosis. Posterior circulation: As noted above, the right vertebral artery is occluded at the foramen magnum. This is probably a recent occlusion. Left vertebral artery is patent through the foramen magnum. There is severe stenosis of the V4 segment, 80% or greater, but the vessel does retain flow, supplying the basilar and giving retrograde flow in the distal right vertebral. There is a proximal basilar fenestration. No basilar stenosis. Superior cerebellar and posterior cerebral vessels show flow. Venous sinuses: Patent and normal. Anatomic variants: None other significant. Delayed phase: No abnormal enhancement. Review of the MIP images confirms the above findings IMPRESSION: 1. Right vertebral artery occluded at the upper cervical region to foramen magnum level. This is probably a recent occlusion and explains the embolic disease within the posterior circulation branches. 2. 80% or greater stenosis of the left V4 segment. 3. Atherosclerotic disease at both carotid bifurcations but no stenosis. 4. Atherosclerotic disease in both carotid siphon regions. Stenosis in the supraclinoid internal carotid arteries estimated at 70-80% bilaterally. Electronically Signed   By: Paulina Fusi M.D.   On: 01/19/2018 19:31   [name]  And Wo Contrast  Result Date: 01/19/2018 CLINICAL DATA:  Dizziness onset 1 week ago. Also diabetes hyperlipidemia hypertension history. Recent falls. EXAM: MRI HEAD WITHOUT AND WITH CONTRAST TECHNIQUE: Multiplanar, multiecho pulse sequences of the brain and surrounding structures were obtained without and with intravenous contrast. CONTRAST:  10 mL Gadovist IV COMPARISON:  CT head 01/19/2018 FINDINGS: Brain: Multiple areas of acute/subacute infarct with restricted diffusion. Several areas show patchy enhancement suggesting subacute duration. Patchy areas of restricted diffusion right cerebellum with enhancement. Restricted diffusion in the right occipital pole also with mild enhancement. Small area of restricted diffusion in the right thalamus. Small area of enhancement in the right caudate consistent with subacute infarct. Small area of restricted diffusion in the left cerebellum without enhancement. Chronic infarct in the deep white matter on the right. This shows evidence of prior mild hemorrhage. Mild atrophy. Negative for mass lesion. Vascular: Normal arterial flow voids Skull and upper cervical spine: Negative Sinuses/Orbits: Moderate mucosal edema paranasal sinuses. Normal orbit Other: None IMPRESSION: Multiple areas of acute/subacute infarct including the cerebellum bilaterally, right occipital lobe, right thalamus, and right caudate. Several of these show enhancement suggesting subacute duration. CTA head neck recommended for further vascular evaluation. Chronic hemorrhage in the deep white matter on the right. Mild atrophy. These results were called by telephone at the time of interpretation on 01/19/2018 at  5:37 pm to Arthor Captain PA , who verbally acknowledged these results. Electronically Signed   By: Marlan Palau M.D.   On: 01/19/2018 17:38    Pamella Pert, MD, PhD [facility] Pager [phone]  If 7PM-7AM, please contact night-coverage [email or web address] Password [email or web address] 01/21/2018,  10:45 AM

## 2018-01-21 NOTE — Progress Notes (Signed)
P.t. b/p remains elevated. However systolic pressure does not exceed 220 as stated in previous RN progress note.

## 2018-01-22 LAB — GLUCOSE, CAPILLARY
Glucose-Capillary: 174 mg/dL — ABNORMAL HIGH (ref 70–99)
Glucose-Capillary: 143 mg/dL — ABNORMAL HIGH (ref 70–99)

## 2018-01-22 MED ORDER — SODIUM CHLORIDE 0.9 % IV SOLN
INTRAVENOUS | Status: DC
  Administered 2018-01-23: 01:00:00 via INTRAVENOUS

## 2018-01-22 NOTE — H&P (View-Only) (Signed)
STROKE TEAM PROGRESS NOTE   SUBJECTIVE (INTERVAL HISTORY) No family is present at bedside.  Patient sitting in bed, no complains. Pending TEE and loop tomorrow.    OBJECTIVE Vitals:   01/22/18 0010 01/22/18 0429 01/22/18 0745 01/22/18 1131  BP: (!) 169/83 119/66 (!) 149/94 (!) 144/87  Pulse: 60 (!) 56 (!) 57 63  Resp: 16 16 16 16  Temp: 98.1 F (36.7 C) 98.6 F (37 C) 98.5 F (36.9 C) 98.9 F (37.2 C)  TempSrc: Oral Oral Oral Oral  SpO2: 100% 98% 98% 95%  Weight:      Height:        CBC:  Recent Labs  Lab 01/19/18 1316 01/20/18 0530 01/21/18 0420  WBC 8.1 6.2 6.3  NEUTROABS 5.5  --   --   HGB 18.1* 16.1 15.4  HCT 54.2* 46.4 46.1  MCV 88.3 88.0 89.3  PLT 310 259 254    Basic Metabolic Panel:  Recent Labs  Lab 01/20/18 0530 01/21/18 0420  NA 139 138  K 3.3* 3.6  CL 104 104  CO2 22 22  GLUCOSE 122* 124*  BUN 10 6*  CREATININE 0.91 1.01  CALCIUM 8.8* 8.8*    Lipid Panel:     Component Value Date/Time   CHOL 228 (H) 01/20/2018 0530   TRIG 111 01/20/2018 0530   HDL 16 (L) 01/20/2018 0530   CHOLHDL 14.3 01/20/2018 0530   VLDL 22 01/20/2018 0530   LDLCALC 190 (H) 01/20/2018 0530   HgbA1c:  Lab Results  Component Value Date   HGBA1C 6.3 (H) 01/20/2018   Urine Drug Screen: No results found for: LABOPIA, COCAINSCRNUR, LABBENZ, AMPHETMU, THCU, LABBARB  Alcohol Level No results found for: ETH  IMAGING  Ct Angio Head W Or Wo Contrast Ct Angio Neck W And/or Wo Contrast 01/19/2018 IMPRESSION:  1. Right vertebral artery occluded at the upper cervical region to foramen magnum level. This is probably a recent occlusion and explains the embolic disease within the posterior circulation branches.  2. 80% or greater stenosis of the left V4 segment.  3. Atherosclerotic disease at both carotid bifurcations but no stenosis.  4. Atherosclerotic disease in both carotid siphon regions. Stenosis in the supraclinoid internal carotid arteries estimated at 70-80%  bilaterally.   Ct Head Wo Contrast 01/19/2018 IMPRESSION:  No acute abnormality. Mild cortical atrophy and chronic microvascular ischemic change. Mild sinus disease.    Ricky Brain W And Wo Contrast 01/19/2018 IMPRESSION:  Multiple areas of acute/subacute infarct including the cerebellum bilaterally, right occipital lobe, right thalamus, and right caudate. Several of these show enhancement suggesting subacute duration. CTA head neck recommended for further vascular evaluation. Chronic hemorrhage in the deep white matter on the right. Mild atrophy.    Transthoracic Echocardiogram  01/21/2018 Study Conclusions - Left ventricle: The cavity size was normal. Wall thickness was   increased in a pattern of mild LVH. Systolic function was normal.   Although no diagnostic regional wall motion abnormality was   identified, this possibility cannot be completely excluded on the   basis of this study. Doppler parameters are consistent with   abnormal left ventricular relaxation (grade 1 diastolic   dysfunction). - Ventricular septum: Abnormal septal motion likely secondary to   conduction delay. - Aortic valve: Trileaflet; mildly thickened leaflets.   Transvalvular velocity was within the normal range. There was no   stenosis. There was trivial regurgitation. Mean gradient (S): 5   mm Hg. Valve area (VTI): 3.29 cm^2. Valve area (Vmax): 2.67 cm^2.     Valve area (Vmean): 2.7 cm^2. - Aortic root: The aortic root was dilated, measuring 49 mm. - Ascending aorta: The ascending aorta was dilated, measuring 43 mm   in the proximal segment. - Mitral valve: Mildly calcified annulus. Chordal calcification.   Transvalvular velocity was within the normal range. There was no   evidence for stenosis. There was trivial regurgitation. - Left atrium: The atrium was normal in size. - Right ventricle: The cavity size was normal. Wall thickness was   normal. Systolic function was normal. RV systolic pressure (S,    est): 14 mm Hg. - Right atrium: Central venous pressure (est): 8 mm Hg. - Tricuspid valve: There was trivial regurgitation. - Inferior vena cava: The vessel was normal in size. The   respirophasic diameter changes were blunted (< 50%). - Pericardium, extracardiac: There was no pericardial effusion. Impressions: - No cardiac source of embolism was identified, but cannot be ruled   out on the basis of this examination due to image quality. Recommendations:  Recommend CT angiogram of the aorta (ECG gated or flash scanned) to evaluate dilated ascending aorta.   PHYSICAL EXAM Blood pressure (!) 144/87, pulse 63, temperature 98.9 F (37.2 C), temperature source Oral, resp. rate 16, height 6' (1.829 m), weight 97.7 kg, SpO2 95 %. Middle-age Caucasian male not in distress. . Afebrile. Head is nontraumatic. Neck is supple without bruit.    Cardiac exam no murmur or gallop. Lungs are clear to auscultation. Distal pulses are well felt. Neurological Exam ;  Awake  Alert oriented x 3. Normal speech and language. eye movements full without nystagmus. fundi were not visualized. Vision acuity and fields appear normal. Hearing is normal. Palatal movements are normal. Face symmetric. Tongue midline. Normal strength, tone, reflexes and coordination with finger-to-nose bilaterally. Normal sensation. Gait not tested.    ASSESSMENT/PLAN Ricky. [place] Ray Hickok Bright. is a 62 y.o. male with history of DM, HLD, and HTN presenting with dizziness and Lt sided numbness after falling. He did not receive IV t-PA due to late presentation.  Stroke: multifocal posterior circulation infarcts involving bilateral cerebellum PICA distribution and the right PCA including right occipital and thalamus - etiology uncertain, could be related to the fall 3 weeks ago with vertebrals dissection versus chronic vertebral artery stenosis/occlusion, versus undiagnosed A. fib due to intermittent heart palpitation  Resultant mild dizziness  with head motion  CT head - No acute abnormality.  MRI head - Multiple areas of acute/subacute infarct including the cerebellum bilaterally, right occipital lobe, right thalamus  CTA H&N - Right vertebral artery occluded.  Left V4  > 80% stenosis.  Stenosis in the supraclinoid internal carotid arteries estimated at 70-80% bilaterally.   2D Echo - normal EF no cardiac source of embolism was identified  Recommend TEE and loop recorder on Monday to rule out cardioembolic source  LDL - 190  HgbA1c - 6.3  VTE prophylaxis - Lovenox  Diet  - Carb modified with thin liquids  No antithrombotic prior to admission, now on aspirin 81 mg daily and clopidogrel 75 mg daily.  Continue DAPT for 3 weeks and then aspirin alone.  Patient counseled to be compliant with his antithrombotic medications  Ongoing aggressive stroke risk factor management  Therapy recommendations:  CIR   Disposition:  Pending  Intracranial stenosis  CTA head and neck showed right VA occlusion, left V4 more than 80% stenosis bilateral ICA siphon 70 to 80% stenosis bilateral carotid bulb atherosclerosis.  ??  Right VA occlusion due to dissection after   fall  On DAPT for 3 weeks and then aspirin alone  Heart palpitation  Patient stated intermittent heart palpitation at night lasting 1 to 2 minutes  Recommend TEE and loop recorder to rule out cardioembolic source  Hypertension  BP occassionally high but within parameters . gradually normalize in 2-3 days . Start amlodipine 10 daily and lisinopril 10 twice daily . Long-term BP goal normotensive  Hyperlipidemia  Lipid lowering medication PTA:  none  LDL 190, goal < 70  Current lipid lowering medication: Lipitor 80 mg daily  Continue statin at discharge  Diabetes  HgbA1c 6.3, goal < 7.0  Controlled  SSI  CBG monitoring  Other Stroke Risk Factors  Advanced age  ETOH use, advised to drink no more than 1 alcoholic beverage per day.  Hx  stroke/TIA by imaging  Family hx stroke (father and grandfather)   Other Active Problems  Hypokalemia 3.3 - supplement -> 3.6  Kaleeah Gingerich, MD PhD Stroke Neurology 01/22/2018 10:32 PM   To contact Stroke Continuity provider, please refer to [email or web address]. After hours, contact General Neurology 

## 2018-01-22 NOTE — Progress Notes (Signed)
PROGRESS NOTE  Ricky Bright. ZOX:[id number] DOB: Oct 19, 1955 DOA: 01/19/2018 PCP: Bing Neighbors, [name]   LOS: 3 days   Brief Narrative / Interim history: 62 year old male history of hypertension, diabetes mellitus type 2, hyperlipidemia, who ran out of his medications and tells me he has never received them from his [place] and has not taken any for the past several months, presents to the hospital on 12/12 for persistent ataxia on walking.  He also has had a fall about 3 weeks ago.  Imaging on admission was positive for acute stroke.  Subjective: -Still has dizziness, about the same.  No other complaints, no chest pain, shortness of breath, no GI discomfort  Assessment & Plan: Active Problems:   Acute CVA (cerebrovascular accident) (HCC)   Hypertensive urgency   Type 2 diabetes mellitus with vascular disease (HCC)   Hyperlipidemia   CVA (cerebral vascular accident) (HCC)   Principal Problem Acute CVA -MRI on admission showed multiple areas of acute/subacute infarct including the cerebellum bilaterally, right occipital lobe, right thalamus and right caudate, some of these being subacute.  MRI was followed by CT angiogram of the head and neck which showed right vertebral artery occluded at the upper cervical region to foramen magnum level, appearing to be a recent occlusion, 80% or greater stenosis of the left V4 segment and atherosclerotic disease at both carotid bifurcations with no stenosis. -Complete stroke work-up, appreciate stroke team input, continue aspirin and Plavix for now, continue statin -2D echo with normal EF, neurology recommends a TEE and loop recorder, I have contacted cardiology  Additional Problems Type 2 diabetes mellitus, controlled -Most recent A1c 6.3, showing control.  Continue sliding scale while hospitalized  Hyperlipidemia -Total cholesterol is 228, LDL is 119.  Continue high-dose statin.  Hypertension -For now allow permissive  hypertension  Scheduled Meds: . amLODipine  10 mg Oral Daily  . aspirin EC  81 mg Oral Daily  . atorvastatin  80 mg Oral q1800  . clopidogrel  75 mg Oral Daily  . enoxaparin (LOVENOX) injection  40 mg Subcutaneous Q24H  . insulin aspart  0-9 Units Subcutaneous TID WC  . lisinopril  10 mg Oral BID   Continuous Infusions:  PRN Meds:.acetaminophen **OR** acetaminophen (TYLENOL) oral liquid 160 mg/5 mL **OR** acetaminophen, senna-docusate  DVT prophylaxis: Lovenox Code Status: Full code Family Communication: No family present at bedside Disposition Plan: CIR evaluating patient  Consultants:   Neurology  Procedures:   2D echo:  Study Conclusions  - Left ventricle: The cavity size was normal. Wall thickness was increased in a pattern of mild LVH. Systolic function was normal. Although no diagnostic regional wall motion abnormality was identified, this possibility cannot be completely excluded on the basis of this study. Doppler parameters are consistent with abnormal left ventricular relaxation (grade 1 diastolic dysfunction). - Ventricular septum: Abnormal septal motion likely secondary to conduction delay. - Aortic valve: Trileaflet; mildly thickened leaflets. Transvalvular velocity was within the normal range. There was no stenosis. There was trivial regurgitation. Mean gradient (S): 5 mm Hg. Valve area (VTI): 3.29 cm^2. Valve area (Vmax): 2.67 cm^2. Valve area (Vmean): 2.7 cm^2. - Aortic root: The aortic root was dilated, measuring 49 mm. - Ascending aorta: The ascending aorta was dilated, measuring 43 mm in the proximal segment. - Mitral valve: Mildly calcified annulus. Chordal calcification. Transvalvular velocity was within the normal range. There was no evidence for stenosis. There was trivial regurgitation. - Left atrium: The atrium was normal in size. - Right ventricle:  The cavity size was normal. Wall thickness was normal. Systolic function was normal. RV systolic pressure  (S, est): 14 mm Hg. - Right atrium: Central venous pressure (est): 8 mm Hg. - Tricuspid valve: There was trivial regurgitation. - Inferior vena cava: The vessel was normal in size. The respirophasic diameter changes were blunted (< 50%). - Pericardium, extracardiac: There was no pericardial effusion.  Antimicrobials:  None   Objective: Vitals:   01/21/18 1956 01/22/18 0010 01/22/18 0429 01/22/18 0745  BP: (!) 164/81 (!) 169/83 119/66 (!) 149/94  Pulse: (!) 57 60 (!) 56 (!) 57  Resp: 16 16 16 16   Temp: 98.6 F (37 C) 98.1 F (36.7 C) 98.6 F (37 C) 98.5 F (36.9 C)  TempSrc: Oral Oral Oral Oral  SpO2: 98% 100% 98% 98%  Weight:      Height:        Intake/Output Summary (Last 24 hours) at 01/22/2018 1052 Last data filed at 01/22/2018 0746 Gross per 24 hour  Intake -  Output 1100 ml  Net -1100 ml   Filed Weights   01/19/18 2132  Weight: 97.7 kg    Examination:  Constitutional: NAD Respiratory: CTA Cardiovascular: Regular rate and rhythm  Data Reviewed: I have independently reviewed following labs and imaging studies   CBC: Recent Labs  Lab 01/19/18 1316 01/20/18 0530 01/21/18 0420  WBC 8.1 6.2 6.3  NEUTROABS 5.5  --   --   HGB 18.1* 16.1 15.4  HCT 54.2* 46.4 46.1  MCV 88.3 88.0 89.3  PLT 310 259 254   Basic Metabolic Panel: Recent Labs  Lab 01/19/18 1316 01/20/18 0530 01/21/18 0420  NA 136 139 138  K 3.5 3.3* 3.6  CL 100 104 104  CO2 20* 22 22  GLUCOSE 156* 122* 124*  BUN 13 10 6*  CREATININE 1.14 0.91 1.01  CALCIUM 9.6 8.8* 8.8*   GFR: Estimated Creatinine Clearance: 89.5 mL/min (by C-G formula based on SCr of 1.01 mg/dL). Liver Function Tests: Recent Labs  Lab 01/20/18 0530  AST 16  ALT 13  ALKPHOS 54  BILITOT 1.2  PROT 6.6  ALBUMIN 3.4*   No results for input(s): LIPASE, AMYLASE in the last 168 hours. No results for input(s): AMMONIA in the last 168 hours. Coagulation Profile: No results for input(s): INR, PROTIME in the last  168 hours. Cardiac Enzymes: No results for input(s): CKTOTAL, CKMB, CKMBINDEX, TROPONINI in the last 168 hours. BNP (last 3 results) No results for input(s): PROBNP in the last 8760 hours. HbA1C: Recent Labs    01/20/18 0530  HGBA1C 6.3*   CBG: Recent Labs  Lab 01/21/18 0633 01/21/18 1133 01/21/18 1640 01/21/18 2139 01/22/18 0852  GLUCAP 121* 147* 140* 115* 174*   Lipid Profile: Recent Labs    01/20/18 0530  CHOL 228*  HDL 16*  LDLCALC 190*  TRIG 111  CHOLHDL 14.3   Thyroid Function Tests: No results for input(s): TSH, T4TOTAL, FREET4, T3FREE, THYROIDAB in the last 72 hours. Anemia Panel: No results for input(s): VITAMINB12, FOLATE, FERRITIN, TIBC, IRON, RETICCTPCT in the last 72 hours. Urine analysis: No results found for: COLORURINE, APPEARANCEUR, LABSPEC, PHURINE, GLUCOSEU, HGBUR, BILIRUBINUR, KETONESUR, PROTEINUR, UROBILINOGEN, NITRITE, LEUKOCYTESUR Sepsis Labs: Invalid input(s): PROCALCITONIN, LACTICIDVEN  No results found for this or any previous visit (from the past 240 hour(s)).    Radiology Studies: No results found.  Pamella Pertostin Mackenzie Groom, MD, PhD [facility] Pager [phone]  If 7PM-7AM, please contact night-coverage [email or web address] Password [email or web address] 01/22/2018, 10:52 AM

## 2018-01-22 NOTE — Progress Notes (Signed)
Occupational Therapy Treatment Patient Details Name: Ricky Bright. MRN: [id number] DOB: [birth year] Today's Date: 01/22/2018    History of present illness This 62 y.o. male admitted with persistent ataxia  LEs.  as well as decreased balance .  MRI showed multiple areas of subacute and acute infarcts involving the cerebellum, Rt occipital and thalamic areas.  CTA showed occlusion of Rt vertebral artey.  PMH includes:  HTN, DM, hypelipidemia   OT comments  Pt is making steady progress toward goals.  He is able to perform LB ADLs with min A - min guard assist, but continues to occasionally loose balance, which places him at risk for fall.  Worked on dynamic reaching and closed chain activities to improve postural control and reduce ataxia.  Pt lives alone, and was very independent PTA.  He needs the intensity and expertise of a rehab team who specializes in stroke to allow him to reach maximal independence and safety.  Will follow.   Follow Up Recommendations  CIR    Equipment Recommendations  Tub/shower bench    Recommendations for Other Services Rehab consult    Precautions / Restrictions Precautions Precautions: Fall       Mobility Bed Mobility Overal bed mobility: Modified Independent                Transfers Overall transfer level: Needs assistance Equipment used: Rolling walker (2 wheeled);None Transfers: Sit to/from [employer] to Stand: Min guard Stand pivot transfers: Min guard       General transfer comment: min guard for safety     Balance Overall balance assessment: Needs assistance Sitting-balance support: Feet supported Sitting balance-Leahy Scale: Good     Standing balance support: During functional activity Standing balance-Leahy Scale: Fair                             ADL either performed or assessed with clinical judgement   ADL Overall ADL's : Needs assistance/impaired             Lower Body  Bathing: Minimal assistance;Sit to/from stand       Lower Body Dressing: Minimal assistance;Sit to/from stand Lower Body Dressing Details (indicate cue type and reason): Pt was instructed in use of sock aid, and was able to return demonstration      Toileting- Clothing Manipulation and Hygiene: Minimal assistance       Functional mobility during ADLs: Min guard;Minimal assistance General ADL Comments: requires assist for balance.      Vision   Additional Comments: Pt denies diplopia.  Nystagmus still noted, and pt reports dizziness, with head turns    Perception     Praxis      Cognition Arousal/Alertness: Awake/alert Behavior During Therapy: WFL for tasks assessed/performed Overall Cognitive Status: Within Functional Limits for tasks assessed Area of Impairment: Memory                                        Exercises Exercises: Other exercises Other Exercises Other Exercises: worked on closed chain activities with focus on increasing control with transitions as well as reaching off BOS.  He was able to perrform with close min guard assist  Other Exercises: worked on [employer] and Rt pursuits with gaze fixation  indepdnently of head and then head turns with eye movements and fixating on targets  Shoulder Instructions       General Comments Problem solved with pt options of pet management     Pertinent Vitals/ Pain       Pain Assessment: No/denies pain  Home Living                                          Prior Functioning/Environment              Frequency  Min 2X/week        Progress Toward Goals  OT Goals(current goals can now be found in the care plan section)  Progress towards OT goals: Progressing toward goals     Plan Discharge plan remains appropriate    Co-evaluation                 AM-PAC OT "6 Clicks" Daily Activity     Outcome Measure   Help from another person eating meals?: None Help from  another person taking care of personal grooming?: A Little Help from another person toileting, which includes using toliet, bedpan, or urinal?: A Little Help from another person bathing (including washing, rinsing, drying)?: A Little Help from another person to put on and taking off regular upper body clothing?: A Little Help from another person to put on and taking off regular lower body clothing?: A Little 6 Click Score: 19    End of Session Equipment Utilized During Treatment: Gait belt;Rolling walker  OT Visit Diagnosis: Unsteadiness on feet (R26.81);Ataxia, unspecified (R27.0)   Activity Tolerance Patient tolerated treatment well   Patient Left in bed;with call bell/phone within reach   Nurse Communication Mobility status        Time: [id number] OT Time Calculation (min): 61 min  Charges: OT General Charges $OT Visit: 1 Visit OT Treatments $Self Care/Home Management : 23-37 mins $Neuromuscular Re-education: 23-37 mins  Jeani HawkingWendi Mehkai Gallo, OTR/L Acute Rehabilitation Services Pager [phone] Office [phone]    Jeani HawkingConarpe, Camille Thau M 01/22/2018, 11:13 AM

## 2018-01-22 NOTE — Progress Notes (Signed)
STROKE TEAM PROGRESS NOTE   SUBJECTIVE (INTERVAL HISTORY) No family is present at bedside.  Patient sitting in bed, no complains. Pending TEE and loop tomorrow.    OBJECTIVE Vitals:   01/22/18 0010 01/22/18 0429 01/22/18 0745 01/22/18 1131  BP: (!) 169/83 119/66 (!) 149/94 (!) 144/87  Pulse: 60 (!) 56 (!) 57 63  Resp: 16 16 16 16   Temp: 98.1 F (36.7 C) 98.6 F (37 C) 98.5 F (36.9 C) 98.9 F (37.2 C)  TempSrc: Oral Oral Oral Oral  SpO2: 100% 98% 98% 95%  Weight:      Height:        CBC:  Recent Labs  Lab 01/19/18 1316 01/20/18 0530 01/21/18 0420  WBC 8.1 6.2 6.3  NEUTROABS 5.5  --   --   HGB 18.1* 16.1 15.4  HCT 54.2* 46.4 46.1  MCV 88.3 88.0 89.3  PLT 310 259 254    Basic Metabolic Panel:  Recent Labs  Lab 01/20/18 0530 01/21/18 0420  NA 139 138  K 3.3* 3.6  CL 104 104  CO2 22 22  GLUCOSE 122* 124*  BUN 10 6*  CREATININE 0.91 1.01  CALCIUM 8.8* 8.8*    Lipid Panel:     Component Value Date/Time   CHOL 228 (H) 01/20/2018 0530   TRIG 111 01/20/2018 0530   HDL 16 (L) 01/20/2018 0530   CHOLHDL 14.3 01/20/2018 0530   VLDL 22 01/20/2018 0530   LDLCALC 190 (H) 01/20/2018 0530   HgbA1c:  Lab Results  Component Value Date   HGBA1C 6.3 (H) 01/20/2018   Urine Drug Screen: No results found for: LABOPIA, COCAINSCRNUR, LABBENZ, AMPHETMU, THCU, LABBARB  Alcohol Level No results found for: ETH  IMAGING  Ct Angio Head W Or Wo Contrast Ct Angio Neck W And/or Wo Contrast 01/19/2018 IMPRESSION:  1. Right vertebral artery occluded at the upper cervical region to foramen magnum level. This is probably a recent occlusion and explains the embolic disease within the posterior circulation branches.  2. 80% or greater stenosis of the left V4 segment.  3. Atherosclerotic disease at both carotid bifurcations but no stenosis.  4. Atherosclerotic disease in both carotid siphon regions. Stenosis in the supraclinoid internal carotid arteries estimated at 70-80%  bilaterally.   Ct Head Wo Contrast 01/19/2018 IMPRESSION:  No acute abnormality. Mild cortical atrophy and chronic microvascular ischemic change. Mild sinus disease.    Mr Ricky Bright And Wo Contrast 01/19/2018 IMPRESSION:  Multiple areas of acute/subacute infarct including the cerebellum bilaterally, right occipital lobe, right thalamus, and right caudate. Several of these show enhancement suggesting subacute duration. CTA head neck recommended for further vascular evaluation. Chronic hemorrhage in the deep white matter on the right. Mild atrophy.    Transthoracic Echocardiogram  01/21/2018 Study Conclusions - Left ventricle: The cavity size was normal. Wall thickness was   increased in a pattern of mild LVH. Systolic function was normal.   Although no diagnostic regional wall motion abnormality was   identified, this possibility cannot be completely excluded on the   basis of this study. Doppler parameters are consistent with   abnormal left ventricular relaxation (grade 1 diastolic   dysfunction). - Ventricular septum: Abnormal septal motion likely secondary to   conduction delay. - Aortic valve: Trileaflet; mildly thickened leaflets.   Transvalvular velocity was within the normal range. There was no   stenosis. There was trivial regurgitation. Mean gradient (S): 5   mm Hg. Valve area (VTI): 3.29 cm^2. Valve area (Vmax): 2.67 cm^2.  Valve area (Vmean): 2.7 cm^2. - Aortic root: The aortic root was dilated, measuring 49 mm. - Ascending aorta: The ascending aorta was dilated, measuring 43 mm   in the proximal segment. - Mitral valve: Mildly calcified annulus. Chordal calcification.   Transvalvular velocity was within the normal range. There was no   evidence for stenosis. There was trivial regurgitation. - Left atrium: The atrium was normal in size. - Right ventricle: The cavity size was normal. Wall thickness was   normal. Systolic function was normal. RV systolic pressure (S,    est): 14 mm Hg. - Right atrium: Central venous pressure (est): 8 mm Hg. - Tricuspid valve: There was trivial regurgitation. - Inferior vena cava: The vessel was normal in size. The   respirophasic diameter changes were blunted (< 50%). - Pericardium, extracardiac: There was no pericardial effusion. Impressions: - No cardiac source of embolism was identified, but cannot be ruled   out on the basis of this examination due to image quality. Recommendations:  Recommend CT angiogram of the aorta (ECG gated or flash scanned) to evaluate dilated ascending aorta.   PHYSICAL EXAM Blood pressure (!) 144/87, pulse 63, temperature 98.9 F (37.2 C), temperature source Oral, resp. rate 16, height 6' (1.829 m), weight 97.7 kg, SpO2 95 %. Middle-age Caucasian male not in distress. . Afebrile. Head is nontraumatic. Neck is supple without bruit.    Cardiac exam no murmur or gallop. Lungs are clear to auscultation. Distal pulses are well felt. Neurological Exam ;  Awake  Alert oriented x 3. Normal speech and language. eye movements full without nystagmus. fundi were not visualized. Vision acuity and fields appear normal. Hearing is normal. Palatal movements are normal. Face symmetric. Tongue midline. Normal strength, tone, reflexes and coordination with finger-to-nose bilaterally. Normal sensation. Gait not tested.    ASSESSMENT/PLAN Mr. Ricky RombergWilliam Ray Debruyne Jr. is a 62 y.o. male with history of DM, HLD, and HTN presenting with dizziness and Lt sided numbness after falling. He did not receive IV t-PA due to late presentation.  Stroke: multifocal posterior circulation infarcts involving bilateral cerebellum PICA distribution and the right PCA including right occipital and thalamus - etiology uncertain, could be related to the fall 3 weeks ago with vertebrals dissection versus chronic vertebral artery stenosis/occlusion, versus undiagnosed A. fib due to intermittent heart palpitation  Resultant mild dizziness  with head motion  CT head - No acute abnormality.  MRI head - Multiple areas of acute/subacute infarct including the cerebellum bilaterally, right occipital lobe, right thalamus  CTA H&N - Right vertebral artery occluded.  Left V4  > 80% stenosis.  Stenosis in the supraclinoid internal carotid arteries estimated at 70-80% bilaterally.   2D Echo - normal EF no cardiac source of embolism was identified  Recommend TEE and loop recorder on Monday to rule out cardioembolic source  LDL - 190  HgbA1c - 6.3  VTE prophylaxis - Lovenox  Diet  - Carb modified with thin liquids  No antithrombotic prior to admission, now on aspirin 81 mg daily and clopidogrel 75 mg daily.  Continue DAPT for 3 weeks and then aspirin alone.  Patient counseled to be compliant with his antithrombotic medications  Ongoing aggressive stroke risk factor management  Therapy recommendations:  CIR   Disposition:  Pending  Intracranial stenosis  CTA head and neck showed right VA occlusion, left V4 more than 80% stenosis bilateral ICA siphon 70 to 80% stenosis bilateral carotid bulb atherosclerosis.  ??  Right VA occlusion due to dissection after  fall  On DAPT for 3 weeks and then aspirin alone  Heart palpitation  Patient stated intermittent heart palpitation at night lasting 1 to 2 minutes  Recommend TEE and loop recorder to rule out cardioembolic source  Hypertension  BP occassionally high but within parameters . gradually normalize in 2-3 days . Start amlodipine 10 daily and lisinopril 10 twice daily . Long-term BP goal normotensive  Hyperlipidemia  Lipid lowering medication PTA:  none  LDL 190, goal < 70  Current lipid lowering medication: Lipitor 80 mg daily  Continue statin at discharge  Diabetes  HgbA1c 6.3, goal < 7.0  Controlled  SSI  CBG monitoring  Other Stroke Risk Factors  Advanced age  ETOH use, advised to drink no more than 1 alcoholic beverage per day.  Hx  stroke/TIA by imaging  Family hx stroke (father and grandfather)   Other Active Problems  Hypokalemia 3.3 - supplement -> 3.6  Marvel Plan, MD PhD Stroke Neurology 01/22/2018 10:32 PM   To contact Stroke Continuity provider, please refer to [email or web address]. After hours, contact General Neurology

## 2018-01-23 ENCOUNTER — Encounter (HOSPITAL_COMMUNITY): Payer: Self-pay

## 2018-01-23 ENCOUNTER — Inpatient Hospital Stay (HOSPITAL_COMMUNITY)
Admission: RE | Admit: 2018-01-23 | Discharge: 2018-01-27 | DRG: 057 | Disposition: A | Discharge status: Home Health | Source: Intra-hospital | Attending: Physical Medicine & Rehabilitation | Admitting: Physical Medicine & Rehabilitation

## 2018-01-23 ENCOUNTER — Inpatient Hospital Stay (HOSPITAL_COMMUNITY)

## 2018-01-23 ENCOUNTER — Ambulatory Visit (HOSPITAL_COMMUNITY): Admit: 2018-01-23 | Admitting: Internal Medicine

## 2018-01-23 ENCOUNTER — Other Ambulatory Visit: Payer: Self-pay

## 2018-01-23 ENCOUNTER — Encounter (HOSPITAL_COMMUNITY)
Admission: EM | Disposition: A | Discharge status: Rehabilitation Facility | Payer: Self-pay | Source: Home / Self Care | Attending: Internal Medicine

## 2018-01-23 DIAGNOSIS — Z9114 Patient's other noncompliance with medication regimen: Secondary | ICD-10-CM

## 2018-01-23 DIAGNOSIS — Z888 Allergy status to other drugs, medicaments and biological substances status: Secondary | ICD-10-CM

## 2018-01-23 DIAGNOSIS — I10 Essential (primary) hypertension: Secondary | ICD-10-CM

## 2018-01-23 DIAGNOSIS — Z823 Family history of stroke: Secondary | ICD-10-CM

## 2018-01-23 DIAGNOSIS — E669 Obesity, unspecified: Secondary | ICD-10-CM | POA: Diagnosis present

## 2018-01-23 DIAGNOSIS — I6389 Other cerebral infarction: Secondary | ICD-10-CM

## 2018-01-23 DIAGNOSIS — I119 Hypertensive heart disease without heart failure: Secondary | ICD-10-CM | POA: Diagnosis present

## 2018-01-23 DIAGNOSIS — R269 Unspecified abnormalities of gait and mobility: Secondary | ICD-10-CM | POA: Diagnosis not present

## 2018-01-23 DIAGNOSIS — E785 Hyperlipidemia, unspecified: Secondary | ICD-10-CM | POA: Diagnosis present

## 2018-01-23 DIAGNOSIS — I6501 Occlusion and stenosis of right vertebral artery: Secondary | ICD-10-CM | POA: Diagnosis present

## 2018-01-23 DIAGNOSIS — K5901 Slow transit constipation: Secondary | ICD-10-CM

## 2018-01-23 DIAGNOSIS — K5900 Constipation, unspecified: Secondary | ICD-10-CM | POA: Diagnosis present

## 2018-01-23 DIAGNOSIS — I6350 Cerebral infarction due to unspecified occlusion or stenosis of unspecified cerebral artery: Secondary | ICD-10-CM | POA: Diagnosis present

## 2018-01-23 DIAGNOSIS — Z79899 Other long term (current) drug therapy: Secondary | ICD-10-CM | POA: Diagnosis not present

## 2018-01-23 DIAGNOSIS — I69393 Ataxia following cerebral infarction: Principal | ICD-10-CM

## 2018-01-23 DIAGNOSIS — I77810 Thoracic aortic ectasia: Secondary | ICD-10-CM | POA: Diagnosis present

## 2018-01-23 DIAGNOSIS — E7800 Pure hypercholesterolemia, unspecified: Secondary | ICD-10-CM | POA: Diagnosis present

## 2018-01-23 DIAGNOSIS — I639 Cerebral infarction, unspecified: Secondary | ICD-10-CM

## 2018-01-23 DIAGNOSIS — Z9181 History of falling: Secondary | ICD-10-CM

## 2018-01-23 DIAGNOSIS — E1159 Type 2 diabetes mellitus with other circulatory complications: Secondary | ICD-10-CM | POA: Diagnosis present

## 2018-01-23 DIAGNOSIS — I69398 Other sequelae of cerebral infarction: Secondary | ICD-10-CM | POA: Diagnosis not present

## 2018-01-23 DIAGNOSIS — R197 Diarrhea, unspecified: Secondary | ICD-10-CM | POA: Diagnosis not present

## 2018-01-23 DIAGNOSIS — Z6829 Body mass index (BMI) 29.0-29.9, adult: Secondary | ICD-10-CM | POA: Diagnosis not present

## 2018-01-23 DIAGNOSIS — E1169 Type 2 diabetes mellitus with other specified complication: Secondary | ICD-10-CM | POA: Diagnosis not present

## 2018-01-23 HISTORY — DX: Cerebral infarction due to unspecified occlusion or stenosis of unspecified cerebral artery: I63.50

## 2018-01-23 HISTORY — PX: TEE WITHOUT CARDIOVERSION: SHX5443

## 2018-01-23 HISTORY — PX: LOOP RECORDER INSERTION: EP1214

## 2018-01-23 LAB — GLUCOSE, CAPILLARY
Glucose-Capillary: 148 mg/dL — ABNORMAL HIGH (ref 70–99)
Glucose-Capillary: 128 mg/dL — ABNORMAL HIGH (ref 70–99)
Glucose-Capillary: 123 mg/dL — ABNORMAL HIGH (ref 70–99)
Glucose-Capillary: 119 mg/dL — ABNORMAL HIGH (ref 70–99)
Glucose-Capillary: 154 mg/dL — ABNORMAL HIGH (ref 70–99)
Glucose-Capillary: 196 mg/dL — ABNORMAL HIGH (ref 70–99)

## 2018-01-23 SURGERY — LOOP RECORDER INSERTION

## 2018-01-23 SURGERY — ECHOCARDIOGRAM, TRANSESOPHAGEAL
Anesthesia: Moderate Sedation

## 2018-01-23 MED ORDER — ACETAMINOPHEN 325 MG PO TABS: 325.0000 mg | ORAL_TABLET | ORAL | Status: DC | PRN

## 2018-01-23 MED ORDER — AMLODIPINE BESYLATE 10 MG PO TABS: 10.0000 mg | ORAL_TABLET | Freq: Every day | ORAL | 1 refills | Status: DC

## 2018-01-23 MED ORDER — INSULIN ASPART 100 UNIT/ML ~~LOC~~ SOLN: 0.0000 [IU] | Freq: Every day | SUBCUTANEOUS | Status: DC

## 2018-01-23 MED ORDER — PRO-STAT SUGAR FREE PO LIQD
30.0000 mL | Freq: Two times a day (BID) | ORAL | Status: DC
  Administered 2018-01-25 – 2018-01-27 (×5): 30 mL via ORAL
  Filled 2018-01-23 (×8): qty 30

## 2018-01-23 MED ORDER — AMLODIPINE BESYLATE 10 MG PO TABS
10.0000 mg | ORAL_TABLET | Freq: Every day | ORAL | Status: DC
  Administered 2018-01-24 – 2018-01-27 (×4): 10 mg via ORAL
  Filled 2018-01-23 (×4): qty 1

## 2018-01-23 MED ORDER — FENTANYL CITRATE (PF) 100 MCG/2ML IJ SOLN
INTRAMUSCULAR | Status: AC
  Filled 2018-01-23: qty 2

## 2018-01-23 MED ORDER — LIDOCAINE-EPINEPHRINE 1 %-1:100000 IJ SOLN
INTRAMUSCULAR | Status: DC | PRN
  Administered 2018-01-23: 30 mL

## 2018-01-23 MED ORDER — ENOXAPARIN SODIUM 40 MG/0.4ML ~~LOC~~ SOLN: 40.0000 mg | SUBCUTANEOUS | Status: DC

## 2018-01-23 MED ORDER — CLOPIDOGREL BISULFATE 75 MG PO TABS
75.0000 mg | ORAL_TABLET | Freq: Every day | ORAL | Status: DC
  Administered 2018-01-24 – 2018-01-27 (×4): 75 mg via ORAL
  Filled 2018-01-23 (×4): qty 1

## 2018-01-23 MED ORDER — ONDANSETRON HCL 4 MG/2ML IJ SOLN: 4.0000 mg | Freq: Four times a day (QID) | INTRAMUSCULAR | Status: DC | PRN

## 2018-01-23 MED ORDER — MIDAZOLAM HCL (PF) 10 MG/2ML IJ SOLN
INTRAMUSCULAR | Status: DC | PRN
  Administered 2018-01-23: 1 mg via INTRAVENOUS
  Administered 2018-01-23 (×2): 2 mg via INTRAVENOUS

## 2018-01-23 MED ORDER — CLOPIDOGREL BISULFATE 75 MG PO TABS: 75.0000 mg | ORAL_TABLET | Freq: Every day | ORAL | 0 refills | Status: DC

## 2018-01-23 MED ORDER — LISINOPRIL 10 MG PO TABS
10.0000 mg | ORAL_TABLET | Freq: Two times a day (BID) | ORAL | Status: DC
  Administered 2018-01-23 – 2018-01-27 (×8): 10 mg via ORAL
  Filled 2018-01-23 (×9): qty 1

## 2018-01-23 MED ORDER — ATORVASTATIN CALCIUM 80 MG PO TABS: 80.0000 mg | ORAL_TABLET | Freq: Every day | ORAL | 1 refills | Status: DC

## 2018-01-23 MED ORDER — ATORVASTATIN CALCIUM 80 MG PO TABS
80.0000 mg | ORAL_TABLET | Freq: Every day | ORAL | Status: DC
  Administered 2018-01-24 – 2018-01-26 (×3): 80 mg via ORAL
  Filled 2018-01-23 (×3): qty 1

## 2018-01-23 MED ORDER — INSULIN ASPART 100 UNIT/ML ~~LOC~~ SOLN
0.0000 [IU] | Freq: Three times a day (TID) | SUBCUTANEOUS | Status: DC
  Administered 2018-01-24 – 2018-01-26 (×5): 1 [IU] via SUBCUTANEOUS

## 2018-01-23 MED ORDER — SENNOSIDES-DOCUSATE SODIUM 8.6-50 MG PO TABS
2.0000 | ORAL_TABLET | Freq: Every day | ORAL | Status: DC
  Administered 2018-01-23: 2 via ORAL
  Filled 2018-01-23: qty 2

## 2018-01-23 MED ORDER — ASPIRIN EC 81 MG PO TBEC
81.0000 mg | DELAYED_RELEASE_TABLET | Freq: Every day | ORAL | Status: DC
  Administered 2018-01-24 – 2018-01-27 (×4): 81 mg via ORAL
  Filled 2018-01-23 (×4): qty 1

## 2018-01-23 MED ORDER — TRAZODONE HCL 50 MG PO TABS: 25.0000 mg | ORAL_TABLET | Freq: Every evening | ORAL | Status: DC | PRN

## 2018-01-23 MED ORDER — ASPIRIN 81 MG PO TBEC: 81.0000 mg | DELAYED_RELEASE_TABLET | Freq: Every day | ORAL | 1 refills | Status: DC

## 2018-01-23 MED ORDER — MIDAZOLAM HCL (PF) 5 MG/ML IJ SOLN
INTRAMUSCULAR | Status: AC
  Filled 2018-01-23: qty 2

## 2018-01-23 MED ORDER — ENOXAPARIN SODIUM 40 MG/0.4ML ~~LOC~~ SOLN
40.0000 mg | SUBCUTANEOUS | Status: DC
  Administered 2018-01-23 – 2018-01-26 (×4): 40 mg via SUBCUTANEOUS
  Filled 2018-01-23 (×4): qty 0.4

## 2018-01-23 MED ORDER — ACETAMINOPHEN 325 MG PO TABS: 650.0000 mg | ORAL_TABLET | Freq: Four times a day (QID) | ORAL | Status: DC | PRN

## 2018-01-23 MED ORDER — BUTAMBEN-TETRACAINE-BENZOCAINE 2-2-14 % EX AERO
INHALATION_SPRAY | CUTANEOUS | Status: DC | PRN
  Administered 2018-01-23: 2 via TOPICAL

## 2018-01-23 MED ORDER — FENTANYL CITRATE (PF) 100 MCG/2ML IJ SOLN
INTRAMUSCULAR | Status: DC | PRN
  Administered 2018-01-23 (×2): 25 ug via INTRAVENOUS

## 2018-01-23 MED ORDER — POLYETHYLENE GLYCOL 3350 17 G PO PACK: 17.0000 g | PACK | Freq: Every day | ORAL | Status: DC | PRN

## 2018-01-23 MED ORDER — LIDOCAINE-EPINEPHRINE 1 %-1:100000 IJ SOLN
INTRAMUSCULAR | Status: AC
  Filled 2018-01-23: qty 1

## 2018-01-23 MED ORDER — LISINOPRIL 10 MG PO TABS: 10.0000 mg | ORAL_TABLET | Freq: Every day | ORAL | 1 refills | Status: DC

## 2018-01-23 MED ORDER — SODIUM CHLORIDE 0.9 % IV SOLN: INTRAVENOUS | Status: DC

## 2018-01-23 MED ORDER — ALUM & MAG HYDROXIDE-SIMETH 200-200-20 MG/5ML PO SUSP: 30.0000 mL | ORAL | Status: DC | PRN

## 2018-01-23 SURGICAL SUPPLY — 2 items
LOOP REVEAL LINQSYS (Prosthesis & Implant Heart) ×3 IMPLANT
PACK LOOP INSERTION (CUSTOM PROCEDURE TRAY) ×3 IMPLANT

## 2018-01-23 NOTE — Progress Notes (Signed)
*  Preliminary Results* Bilateral lower extremity venous duplex completed. Bilateral lower extremities are negative for deep vein thrombosis. There is no evidence of Baker's cyst bilaterally.  01/23/2018 2:27 PM Ricky Bright Clare Gandyiddle

## 2018-01-23 NOTE — CV Procedure (Signed)
Procedure: TEE  Indication: CVA  Sedation: Versed 5 mg IV, Fentanyl 50 mcg IV  Findings: Please see echo section for full report.  Normal LV size with mild to moderate LV hypertrophy.  EF 55-60%. Mild septal bounce noted suggesting LBBB.  Normal RV size and systolic function.  Normal left and right atrial sizes.  No LA appendage thrombus.  There was a small PFO noted by color doppler and bubble study.  No significant tricuspid regurgitation.  Trivial MR.  Trileaflet aortic valve with trivial AI.  No aortic stenosis.  The aortic root was dilated to 4.4 cm, the ascending aorta measured 4.0 cm.  There was mild plaque in the descending thoracic aorta.    Impression: PFO noted.    Ricky AnconaDalton Kewanda Bright 01/23/2018 10:39 AM

## 2018-01-23 NOTE — Progress Notes (Signed)
Physical Therapy Treatment Patient Details Name: Nedim Oki. MRN: [id number] DOB: 1955/06/17 Today's Date: 01/23/2018    History of Present Illness This 62 y.o. male admitted with persistent ataxia  LEs.  as well as decreased balance .  MRI showed multiple areas of subacute and acute infarcts involving the cerebellum, Rt occipital and thalamic areas.  CTA showed occlusion of Rt vertebral artey.  PMH includes:  HTN, DM, hypelipidemia    PT Comments    Pt supine in bed on arrival he continues to present with balance deficits and is heavily reliant on RW for support.  Pt continues to be an excellent candidate for CIR therapies to return to baseline without use of device to be able to return home and care for his cat and dog.  Pt reports feeling lightheaded when changing positions but denies feeling truly dizzy. I have spoken with patient and he feels his needs cannot be met at home at this time.  Pt is agreeable to aggressive therapies at CIR to return home independent.  I feel he can make these gains with aggressive rehab.  Plan next session for higher level balance activities and introducing gait without device.      Follow Up Recommendations  CIR;Supervision for mobility/OOB     Equipment Recommendations  None recommended by PT    Recommendations for Other Services OT consult;Rehab consult     Precautions / Restrictions Precautions Precautions: Fall Restrictions Weight Bearing Restrictions: No    Mobility  Bed Mobility Overal bed mobility: Modified Independent Bed Mobility: Supine to Sit;Sit to Supine     Supine to sit: Modified independent (Device/Increase time) Sit to supine: Modified independent (Device/Increase time)   General bed mobility comments: No assistance needed to move in and out of bed.    Transfers Overall transfer level: Needs assistance Equipment used: Rolling walker (2 wheeled);None Transfers: Sit to/from Stand Sit to Stand: Min guard Stand  pivot transfers: Min guard       General transfer comment: min guard for safety, cues for hand placement as patient pulls on RW to achieve standing.    Ambulation/Gait Ambulation/Gait assistance: Min guard Gait Distance (Feet): 150 Feet Assistive device: Rolling walker (2 wheeled) Gait Pattern/deviations: Step-through pattern;Decreased stride length;Drifts right/left;Trunk flexed Gait velocity: Decreased    General Gait Details: Pt continues to present with an unsteady gait pattern and required cues for RW safety, increased posture/upper trunk control, and cues for pacing.  Pt remains heavily reliant of RW and was not using a device at baseline.     Stairs             Wheelchair Mobility    Modified Rankin (Stroke Patients Only) Modified Rankin (Stroke Patients Only) Pre-Morbid Rankin Score: No symptoms Modified Rankin: Moderately severe disability     Balance Overall balance assessment: Needs assistance   Sitting balance-Leahy Scale: Good       Standing balance-Leahy Scale: Fair                              Cognition Arousal/Alertness: Awake/alert Behavior During Therapy: WFL for tasks assessed/performed Overall Cognitive Status: Within Functional Limits for tasks assessed                                        Exercises General Exercises - Lower Extremity Long Arc Quad: AROM;Both;10 reps;Seated  Hip Flexion/Marching: AROM;Both;10 reps;Seated    General Comments        Pertinent Vitals/Pain Pain Assessment: No/denies pain    Home Living                      Prior Function            PT Goals (current goals can now be found in the care plan section) Acute Rehab PT Goals Patient Stated Goal: to improve balance and be able to care for cat and dog  Potential to Achieve Goals: Good Progress towards PT goals: Progressing toward goals    Frequency    Min 4X/week      PT Plan Current plan remains  appropriate    Co-evaluation              AM-PAC PT "6 Clicks" Mobility   Outcome Measure  Help needed turning from your back to your side while in a flat bed without using bedrails?: None Help needed moving from lying on your back to sitting on the side of a flat bed without using bedrails?: None Help needed moving to and from a bed to a chair (including a wheelchair)?: A Little Help needed standing up from a chair using your arms (e.g., wheelchair or bedside chair)?: A Little Help needed to walk in hospital room?: A Little Help needed climbing 3-5 steps with a railing? : A Little 6 Click Score: 20    End of Session Equipment Utilized During Treatment: Gait belt Activity Tolerance: Patient limited by fatigue Patient left: in bed;with call bell/phone within reach Nurse Communication: Mobility status PT Visit Diagnosis: Other abnormalities of gait and mobility (R26.89);Muscle weakness (generalized) (M62.81)     Time: 0370-4888 PT Time Calculation (min) (ACUTE ONLY): 15 min  Charges:  $Gait Training: 8-22 mins                     Governor Rooks, PTA Acute Rehabilitation Services Pager [phone] Office [phone]     Cristela Blue 01/23/2018, 8:46 AM

## 2018-01-23 NOTE — Progress Notes (Signed)
Pt arrived to unit post loop recorder placement at approx [phone] with staff from cath-EP lab. dsg to left upper chest with scant amount of blood, area was marked by cath lab staff. Pt alert and oriented, vital signs taken, see graphics flow sheet.

## 2018-01-23 NOTE — H&P (Signed)
Physical Medicine and Rehabilitation Admission H&P    Chief Complaint  Patient presents with  . Stroke with functional deficits.   [name].     HPI: Ricky RombergWilliam Ray Orzel Jr. Is a 62 year old male with history of HTN, T2DM, hyperlipidemia, medication noncompliance for the past few months; who was admitted on 01/19/2018 with reports of a fall off a truck 3 weeks prior to admission and 1 week history of problems problems when walking and dizziness.  History taken from chart review and patient.  MRI brain reviewed, showing multifocal infarcts.  Per report, multiple acute/subacute infarcts bilateral cerebellum, right occipital lobe, right thalamic and right caudate nucleus. CTA head neck showed right vertebral artery occlusion at upper cervical region the foramen magnum level probably recent occlusion explaining embolic disease with the posterior circulation and 80% or greater stenosis left V4 segment.    Stroke felt to be embolic-etiology uncertain could be related to fall with vertebral dissection versus chronic VA stenosis/occlusion versus undiagnosed A. fib.  Dr. Raynald KempHsu recommended DAPT x3 weeks followed by aspirin alone.  2D echo showed mild LVH with trivial aortic valve regurg.  He underwent TEE revealing mild to moderate LV hypertrophy, EF 55 to 60% and small PFO by color Doppler and bubble study, dilated aortic root 4.4 cm and ascending aorta at 4.0 cm.  Loop recorder placed. BLE Dopplers were negative for DVT. Blood pressures remain labile.  He continues to be limited by balance deficits as well as feeling of lightheadedness with positional changes.  CIR recommended for follow-up therapy   Review of Systems  Constitutional: Negative for chills and fever.  HENT: Negative for hearing loss and tinnitus.   Eyes: Negative for blurred vision and double vision.  Respiratory: Negative for cough, sputum production and shortness of breath.   Cardiovascular: Negative for chest pain and palpitations.    Gastrointestinal: Positive for constipation. Negative for heartburn and nausea.  Genitourinary: Negative for dysuria.  Musculoskeletal: Negative for back pain and myalgias.  Skin: Negative for rash.  Neurological: Negative for dizziness, weakness and headaches.       Balance deficits  Psychiatric/Behavioral: Negative for memory loss. The patient does not have insomnia.   All other systems reviewed and are negative.     Past Medical History:  Diagnosis Date  . Diabetes mellitus without complication (HCC)   . HLD (hyperlipidemia)   . Hypercholesteremia   . Hypertension     Past Surgical History:  Procedure Laterality Date  . APPENDECTOMY    . bicep tendon rupture.      Family History  Problem Relation Age of Onset  . Stroke Father   . Stroke Paternal Grandfather     Social History:  reports that he has never smoked. He has never used smokeless tobacco. He reports previous alcohol use. He reports that he does not use drugs.   Allergies  Allergen Reactions  . Trazodone And Nefazodone Shortness Of Breath    Medications Prior to Admission  Medication Sig Dispense Refill  . hydroxypropyl methylcellulose / hypromellose (ISOPTO TEARS / GONIOVISC) 2.5 % ophthalmic solution Place 1 drop into both eyes daily as needed for dry eyes.    . Menthol, Topical Analgesic, (ICY HOT EX) Apply 1 application topically daily as needed (muscle pain).      Drug Regimen Review  Drug regimen was reviewed and remains appropriate with no significant issues identified  Home: Home Living Family/patient expects to be discharged to:: Private residence Living Arrangements: Alone Available Help at Discharge:  Family, Available PRN/intermittently Type of Home: House Home Access: Stairs to enter Secretary/administrator of Steps: 2 Home Layout: One level Bathroom Shower/Tub: Engineer, manufacturing systems: Standard Bathroom Accessibility: Yes Home Equipment: None Additional Comments: Pt has a  cat and dog for whom he is reponsible   Lives With: Alone   Functional History: Prior Function Level of Independence: Independent Comments: Pt was fully independent prior to fall/onset of symptoms.  Since that time, he has had multiple falls and has stopped driving due to feeling insecure.  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   Transfers Overall transfer level: Needs assistance Equipment used: Rolling walker (2 wheeled), None Transfers: Sit to/from Stand Sit to Stand: Min guard Stand pivot transfers: Min guard General transfer comment: min guard for safety, cues for hand placement as patient pulls on RW to achieve standing.   Ambulation/Gait Ambulation/Gait assistance: Min guard Gait Distance (Feet): 150 Feet Assistive device: Rolling walker (2 wheeled) Gait Pattern/deviations: Step-through pattern, Decreased stride length, Drifts right/left, Trunk flexed General Gait Details: Pt continues to present with an unsteady gait pattern and required cues for RW safety, increased posture/upper trunk control, and cues for pacing.  Pt remains heavily reliant of RW and was not using a device at baseline.   Gait velocity: Decreased     ADL: ADL Overall ADL's : Needs assistance/impaired Eating/Feeding: Independent Grooming: Wash/dry hands, Wash/dry face, Oral care, Brushing hair, Minimal assistance, Standing Upper Body Bathing: Supervision/ safety, Set up, Sitting Lower Body Bathing: Minimal assistance, Sit to/from stand Upper Body Dressing : Set up, Sitting Lower Body Dressing: Minimal assistance, Sit to/from stand Lower Body Dressing  Details (indicate cue type and reason): Pt was instructed in use of sock aid, and was able to return demonstration  Toilet Transfer: Minimal assistance, Ambulation, Comfort height toilet Toileting- Clothing Manipulation and Hygiene: Minimal assistance Functional mobility during ADLs: Min guard, Minimal assistance General ADL Comments: requires assist for balance.   Cognition: Cognition Overall Cognitive Status: Within Functional Limits for tasks assessed Orientation Level: Oriented X4 Cognition Arousal/Alertness: Awake/alert Behavior During Therapy: WFL for tasks assessed/performed Overall Cognitive Status: Within Functional Limits for tasks assessed Area of Impairment: Memory Memory: Decreased short-term memory General Comments: Pt noted to frequently repeat himself    Blood pressure (!) 142/90, pulse (!) 58, temperature 98.3 F (36.8 C), temperature source Oral, resp. rate 16, height 6' (1.829 m), weight 97.7 kg, SpO2 95 %. Physical Exam  Nursing note and vitals reviewed. Constitutional: He is oriented to person, place, and time. He appears well-developed and well-nourished.  Obese male. NAD  HENT:  Head: Normocephalic and atraumatic.  Eyes: EOM are normal. Right eye exhibits no discharge. Left eye exhibits no discharge.  Neck: Normal range of motion. Neck supple.  Cardiovascular: Normal rate and regular rhythm.  Respiratory: Effort normal and breath sounds normal.  GI: Soft. Bowel sounds are normal. He exhibits no distension. There is no abdominal tenderness.  Musculoskeletal:     Comments: No edema or tenderness in extremities  Neurological: He is alert and oriented to person, place, and time.  Motor: 5/5 throughout Sensation intact light touch Minimal ataxia left upper extremity  Skin: Skin is  warm and dry.  Psychiatric: He has a normal mood and affect. His behavior is normal.    Results for orders placed or performed during the hospital encounter of 01/19/18 (from the  past 48 hour(s))  Glucose, capillary     Status: Abnormal   Collection Time: 01/21/18  4:40 PM  Result Value Ref Range   Glucose-Capillary 140 (H) 70 - 99 mg/dL  Glucose, capillary     Status: Abnormal   Collection Time: 01/21/18  9:39 PM  Result Value Ref Range   Glucose-Capillary 115 (H) 70 - 99 mg/dL  Glucose, capillary     Status: Abnormal   Collection Time: 01/22/18  8:52 AM  Result Value Ref Range   Glucose-Capillary 174 (H) 70 - 99 mg/dL  Glucose, capillary     Status: Abnormal   Collection Time: 01/22/18 11:35 AM  Result Value Ref Range   Glucose-Capillary 143 (H) 70 - 99 mg/dL   Comment 1 Notify RN    Comment 2 Document in Chart   Glucose, capillary     Status: Abnormal   Collection Time: 01/22/18  4:19 PM  Result Value Ref Range   Glucose-Capillary 148 (H) 70 - 99 mg/dL  Glucose, capillary     Status: Abnormal   Collection Time: 01/22/18  9:53 PM  Result Value Ref Range   Glucose-Capillary 128 (H) 70 - 99 mg/dL  Glucose, capillary     Status: Abnormal   Collection Time: 01/23/18  8:48 AM  Result Value Ref Range   Glucose-Capillary 123 (H) 70 - 99 mg/dL   Comment 1 Notify RN    Comment 2 Document in Chart   Glucose, capillary     Status: Abnormal   Collection Time: 01/23/18 11:34 AM  Result Value Ref Range   Glucose-Capillary 119 (H) 70 - 99 mg/dL   No results found.     Medical Problem List and Plan: 1. Balance deficits as well as feeling of lightheadedness with positional changes secondary to bilateral infarcts. 2.  DVT Prophylaxis/Anticoagulation: Pharmaceutical: Lovenox 3. Pain Management: Tylenol as needed 4. Mood: Use LCSW to follow for evaluation and support 5. Neuropsych: This patient is capable of making decisions on his own behalf. 6. Skin/Wound Care: Routine pressure leave mesh 7. Fluids/Electrolytes/Nutrition: Monitor I's and O's.  Offer supplements between meals if intake poor 8.  Hypertension: Monitor blood twice daily.  Continue amlodipine  and lisinopril. 9. T2DM: Hemoglobin A1c-6.3.  Monitor blood sugars before meals at bedtime--use sliding scale insulin for tighter control.  10.  Dyslipidemia: Now on Lipitor 11. Constipation: Will add senna at bedtime.    Post Admission Physician Evaluation: 1. Preadmission assessment reviewed and changes made below. 2. Functional deficits secondary  to bilateral infarcts. 3. Patient is admitted to receive collaborative, interdisciplinary care between the physiatrist, rehab nursing staff, and therapy team. 4. Patient has experienced substantial functional loss from his/her baseline which was documented above under the "Functional History" and "Functional Status" headings.  Judging by the patient's diagnosis, physical exam, and functional history, the patient has potential for functional progress which will result in measurable gains while on inpatient rehab.  These gains will be of substantial and practical use upon discharge  in facilitating mobility and self-care at the household level. 5. Physiatrist will provide 24 hour management of medical needs as well as oversight of the therapy plan/treatment and provide guidance as appropriate regarding the interaction of the two. 6. 24 hour rehab nursing will assist with safety, disease management and patient education  and help integrate therapy concepts, techniques,education, etc. 7. PT will assess and treat for/with: Lower extremity strength, range of motion, stamina, balance, functional mobility, safety, adaptive techniques and equipment, coping skills, pain control, education. Goals are: Mod I. 8. OT will assess and treat for/with: ADL's, functional mobility, safety, upper extremity strength, adaptive techniques and equipment, ego support, and community reintegration.   Goals are: Mod I. Therapy may proceed with showering this patient. 9. Case Management and Social Worker will assess and treat for psychological issues and discharge planning. 10. Team  conference will be held weekly to assess progress toward goals and to determine barriers to discharge. 11. Patient will receive at least 3 hours of therapy per day at least 5 days per week. 12. ELOS: 3-6 days.       13. Prognosis:  good  I have personally performed a face to face diagnostic evaluation, including, but not limited to relevant history and physical exam findings, of this patient and developed relevant assessment and plan.  Additionally, I have reviewed and concur with the physician assistant's documentation above.  The patient's status has not changed. The original post admission physician evaluation remains appropriate, and any changes from the pre-admission screening or documentation from the acute chart are noted above.    Maryla Morrow, MD, ABPMR Jacquelynn Cree, PA-C 01/23/2018

## 2018-01-23 NOTE — Progress Notes (Signed)
STROKE TEAM PROGRESS NOTE   SUBJECTIVE (INTERVAL HISTORY) No family is present at bedside.  Patient lying in bed, no discomfort.  Had TEE showed small PFO, but DVT negative.  Had loop recorder.  Pending CIR admission.  [facility] and will have neurology referral in that facility.   OBJECTIVE Vitals:   01/23/18 1137 01/23/18 1640 01/23/18 1652 01/23/18 1700  BP: (!) 142/90 (!) 167/83 (!) 156/79 (!) 165/82  Pulse: (!) 58 73 72 75  Resp: 16 16    Temp: 98.3 F (36.8 C) 98.2 F (36.8 C)    TempSrc: Oral Oral    SpO2: 95% 98%    Weight:      Height:        CBC:  Recent Labs  Lab 01/19/18 1316 01/20/18 0530 01/21/18 0420  WBC 8.1 6.2 6.3  NEUTROABS 5.5  --   --   HGB 18.1* 16.1 15.4  HCT 54.2* 46.4 46.1  MCV 88.3 88.0 89.3  PLT 310 259 254    Basic Metabolic Panel:  Recent Labs  Lab 01/20/18 0530 01/21/18 0420  NA 139 138  K 3.3* 3.6  CL 104 104  CO2 22 22  GLUCOSE 122* 124*  BUN 10 6*  CREATININE 0.91 1.01  CALCIUM 8.8* 8.8*    Lipid Panel:     Component Value Date/Time   CHOL 228 (H) 01/20/2018 0530   TRIG 111 01/20/2018 0530   HDL 16 (L) 01/20/2018 0530   CHOLHDL 14.3 01/20/2018 0530   VLDL 22 01/20/2018 0530   LDLCALC 190 (H) 01/20/2018 0530   HgbA1c:  Lab Results  Component Value Date   HGBA1C 6.3 (H) 01/20/2018   Urine Drug Screen: No results found for: LABOPIA, COCAINSCRNUR, LABBENZ, AMPHETMU, THCU, LABBARB  Alcohol Level No results found for: ETH  IMAGING  Ct Angio Head W Or Wo Contrast Ct Angio Neck W And/or Wo Contrast 01/19/2018 IMPRESSION:  1. Right vertebral artery occluded at the upper cervical region to foramen magnum level. This is probably a recent occlusion and explains the embolic disease within the posterior circulation branches.  2. 80% or greater stenosis of the left V4 segment.  3. Atherosclerotic disease at both carotid bifurcations but no stenosis.  4. Atherosclerotic disease in both carotid siphon  regions. Stenosis in the supraclinoid internal carotid arteries estimated at 70-80% bilaterally.   Ct Head Wo Contrast 01/19/2018 IMPRESSION:  No acute abnormality. Mild cortical atrophy and chronic microvascular ischemic change. Mild sinus disease.    Mr Laqueta JeanBrain W And Wo Contrast 01/19/2018 IMPRESSION:  Multiple areas of acute/subacute infarct including the cerebellum bilaterally, right occipital lobe, right thalamus, and right caudate. Several of these show enhancement suggesting subacute duration. CTA head neck recommended for further vascular evaluation. Chronic hemorrhage in the deep white matter on the right. Mild atrophy.     PHYSICAL EXAM Blood pressure (!) 165/82, pulse 75, temperature 98.2 F (36.8 C), temperature source Oral, resp. rate 16, height 6' (1.829 m), weight 97.7 kg, SpO2 98 %. Middle-age Caucasian male not in distress. . Afebrile. Head is nontraumatic. Neck is supple without bruit.    Cardiac exam no murmur or gallop. Lungs are clear to auscultation. Distal pulses are well felt. Neurological Exam ;  Awake  Alert oriented x 3. Normal speech and language. eye movements full without nystagmus. fundi were not visualized. Vision acuity and fields appear normal. Hearing is normal. Palatal movements are normal. Face symmetric. Tongue midline. Normal strength, tone, reflexes and coordination  with finger-to-nose bilaterally. Normal sensation. Gait not tested.    ASSESSMENT/PLAN [name] is a 62 y.o. male with history of DM, HLD, and HTN presenting with dizziness and Lt sided numbness after falling. He did not receive IV t-PA due to late presentation.  Stroke: multifocal posterior circulation infarcts involving bilateral cerebellum PICA distribution and the right PCA including right occipital and thalamus - etiology uncertain, could be related to the fall 3 weeks ago with vertebrals dissection versus chronic vertebral artery stenosis/occlusion, versus undiagnosed  A. fib due to intermittent heart palpitation  Resultant mild dizziness with head motion  CT head - No acute abnormality.  MRI head - Multiple areas of acute/subacute infarct including the cerebellum bilaterally, right occipital lobe, right thalamus  CTA H&N - Right vertebral artery occluded.  Left V4  > 80% stenosis.  Stenosis in the supraclinoid internal carotid arteries estimated at 70-80% bilaterally.   2D Echo - normal EF no cardiac source of embolism was identified  TEE unremarkable but small PFO  LE venous Doppler no DVT  Loop recorder placed  LDL - 190  HgbA1c - 6.3  VTE prophylaxis - Lovenox  Diet  - Carb modified with thin liquids  No antithrombotic prior to admission, now on aspirin 81 mg daily and clopidogrel 75 mg daily.  Continue DAPT for 3 weeks and then aspirin alone.  Patient counseled to be compliant with his antithrombotic medications  Ongoing aggressive stroke risk factor management  Therapy recommendations:  CIR   Disposition:  Pending  Intracranial stenosis  CTA head and neck showed right VA occlusion, left V4 more than 80% stenosis bilateral ICA siphon 70 to 80% stenosis bilateral carotid bulb atherosclerosis.  ??  Right VA occlusion due to dissection after fall  On DAPT for 3 weeks and then aspirin alone  Heart palpitation  Patient stated intermittent heart palpitation at night lasting 1 to 2 minutes  TEE unremarkable but small PFO  LE venous Doppler no DVT  Loop recorder placed  Hypertension  BP occassionally high but within parameters . gradually normalize in 2-3 days . Start amlodipine 10 daily and lisinopril 10 twice daily . Long-term BP goal normotensive  Hyperlipidemia  Lipid lowering medication PTA:  none  LDL 190, goal < 70  Current lipid lowering medication: Lipitor 80 mg daily  Continue statin at discharge  Diabetes  HgbA1c 6.3, goal < 7.0  Controlled  SSI  CBG monitoring  Other Stroke Risk  Factors  Advanced age  ETOH use, advised to drink no more than 1 alcoholic beverage per day.  Hx stroke/TIA by imaging  Family hx stroke (father and grandfather)  Other Active Problems  Hypokalemia 3.3 - supplement -> 3.6  Neurology will sign off. Please call with questions. Pt will follow up with [facility] facility for neurology follow-up. Thanks for the consult.   Marvel Plan, MD PhD Stroke Neurology 01/23/2018 5:34 PM   To contact Stroke Continuity provider, please refer to [email or web address]. After hours, contact General Neurology

## 2018-01-23 NOTE — Progress Notes (Signed)
Jamse Arn, MD  Physician  Physical Medicine and Rehabilitation  PMR Pre-admission  Addendum  Date of Service:  01/23/2018 1:56 PM       Related encounter: ED to Hosp-Admission (Current) from 01/19/2018 in [place]         Show:Clear all [x] Manual[x] Template[x] Copied  Added by: [x] Anjolaoluwa Siguenza, Vertis Kelch, RN[x] Jamse Arn, MD  [] Hover for details PMR Admission Coordinator Pre-Admission Assessment  Patient: Ricky Bright. is an 62 y.o., male MRN: [id number] DOB: 10/19/1955 Height: 6' (182.9 cm) Weight: 97.7 kg                                                                                                                                                  Insurance Information HMO:     PPO:      PCP:      IPA:      80/20:      OTHER: Programmer, systems /retired PRIMARYDyanne Iha      Policy#: [id number]      Subscriber: pt CM Name: Otila Kluver      Phone#: [phone]     Fax#: online uploaded Pre-Cert#: [id number]      Employer: retired Nature conservation officer Benefits:  Phone #: online     Name: 01/23/2018 Eff. Date: 02/09/2016     Deduct: $150      Out of Pocket Max: $3000      Life Max: none CIR: $250 co pay per day until OOP max met      SNF: $250 per day until OOP max met Outpatient: $41 per visit     Co-Pay: visits per medical neccesity and must be authorized Home Health: 100%      Co-Pay: must be authorized DME: 80%     Co-Pay: 20% Providers: in network  SECONDARY: none       Medicaid Application Date:       Case Manager:  Disability Application Date:       Case Worker:   Emergency Publishing copy Information    Name Relation Home Work Mobile   Yanke,Melven Son   [phone]     Current Medical History  Patient Admitting Diagnosis: post circulation infarcts  History of Present Illness:Ricky Bright is a 62 year old right-handed male with history of hypertension, hyperlipidemia and diabetes mellitus on no  prescription medications.  Presented 01/19/2018 with persistent ataxia. Patient states recent fall off a truck 3 weeks ago and has had balance difficulties since that time. Denied any swallowing, speech or visual deficits. MRI showed multiple areas of acute subacute infarction including the cerebellum bilaterally right occipital lobe, right thalamus and right caudate. CT angiogram of head and neck right vertebral artery is occluded at the upper cervical region to the foramen magnum level. 80% or greater stenosis of  the left V4 segment. Patient did not receive TPA. Echocardiogram with grade 1 diastolic dysfunction. Systolic function was normal.No cardiac source of embolism was identified. TEE Showed ejection fraction of 60% no LAD up in the appendage thrombus. There was a small PFO. No significant tricuspid regurgitation.and loop recorder placement 01/23/2018. Currently maintained on aspirin and Plavix for CVA prophylaxis 3 weeks then aspirin alone. Subcutaneous Lovenox for DVT prophylaxis. Tolerating a regular diet.   Complete NIHSS TOTAL: 0  Past Medical History      Past Medical History:  Diagnosis Date  . Diabetes mellitus without complication ([place])   . HLD (hyperlipidemia)   . Hypercholesteremia   . Hypertension     Family History  family history includes Stroke in his father and paternal grandfather.  Prior Rehab/Hospitalizations:  Has the patient had major surgery during 100 days prior to admission? No  Current Medications   Current Facility-Administered Medications:  .  0.9 %  sodium chloride infusion, , Intravenous, Continuous, Rosalin Hawking, MD, Last Rate: 50 mL/hr at 01/23/18 0041 .  acetaminophen (TYLENOL) tablet 650 mg, 650 mg, Oral, Q4H PRN **OR** acetaminophen (TYLENOL) solution 650 mg, 650 mg, Per Tube, Q4H PRN **OR** acetaminophen (TYLENOL) suppository 650 mg, 650 mg, Rectal, Q4H PRN, Rise Patience, MD .  amLODipine (NORVASC) tablet 10 mg, 10 mg, Oral,  Daily, Rosalin Hawking, MD, 10 mg at 01/23/18 1140 .  aspirin EC tablet 81 mg, 81 mg, Oral, Daily, Rise Patience, MD, 81 mg at 01/23/18 1140 .  atorvastatin (LIPITOR) tablet 80 mg, 80 mg, Oral, q1800, Rise Patience, MD, 80 mg at 01/22/18 1815 .  clopidogrel (PLAVIX) tablet 75 mg, 75 mg, Oral, Daily, Rise Patience, MD, 75 mg at 01/23/18 1140 .  enoxaparin (LOVENOX) injection 40 mg, 40 mg, Subcutaneous, Q24H, Rise Patience, MD, 40 mg at 01/22/18 2238 .  insulin aspart (novoLOG) injection 0-9 Units, 0-9 Units, Subcutaneous, TID WC, Rise Patience, MD, 1 Units at 01/22/18 1728 .  lisinopril (PRINIVIL,ZESTRIL) tablet 10 mg, 10 mg, Oral, BID, Rosalin Hawking, MD, 10 mg at 01/23/18 1140 .  senna-docusate (Senokot-S) tablet 1 tablet, 1 tablet, Oral, QHS PRN, Rise Patience, MD  Patients Current Diet:     Diet Order                  Diet NPO time specified Except for: Sips with Meds  Diet effective midnight               Precautions / Restrictions Precautions Precautions: Fall Restrictions Weight Bearing Restrictions: No   Has the patient had 2 or more falls or a fall with injury in the past year?Yes  Prior Activity Level Community (5-7x/wk): INdependent and active; driving; retired Information systems manager. Was independent and driving until 3 weeks ago. [place] while moving to his new home from the back step off the rental truck. Hit his head and his legs. Numerous falls since that time.  Home Assistive Devices / Equipment Home Assistive Devices/Equipment: None Home Equipment: None  Prior Device Use: Indicate devices/aids used by the patient prior to current illness, exacerbation or injury? None of the above  Prior Functional Level Prior Function Level of Independence: Independent Comments: Pt was fully independent prior to fall/onset of symptoms.  Since that time, he has had multiple falls and has stopped driving due to feeling insecure.   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  Independent  Indoor Mobility: Did the patient need assistance with walking from room to room (with or without device)? Independent  Stairs: Did the patient need assistance with internal or external stairs (with or without device)? Independent  Functional Cognition: Did the patient need help planning regular tasks such as shopping or remembering to take medications? Independent  Current Functional Level Cognition  Overall Cognitive Status: Within Functional Limits for tasks assessed Orientation Level: Oriented X4 General Comments: Pt noted to frequently repeat himself     Extremity Assessment (includes Sensation/Coordination)  Upper Extremity Assessment: Defer to OT evaluation RUE Deficits / Details: tremor noted which he reports is long stangind   Lower Extremity Assessment: Defer to PT evaluation RLE Coordination: decreased gross motor LLE Coordination: decreased gross motor    ADLs  Overall ADL's : Needs assistance/impaired Eating/Feeding: Independent Grooming: Wash/dry hands, Wash/dry face, Oral care, Brushing hair, Minimal assistance, Standing Upper Body Bathing: Supervision/ safety, Set up, Sitting Lower Body Bathing: Minimal assistance, Sit to/from stand Upper Body Dressing : Set up, Sitting Lower Body Dressing: Minimal assistance, Sit to/from stand Lower Body Dressing Details (indicate cue type and reason): Pt was instructed in use of sock aid, and was able to return demonstration  Toilet Transfer: Minimal assistance, Ambulation, Comfort height toilet Toileting- Clothing Manipulation and Hygiene: Minimal assistance Functional mobility during ADLs: Min guard, Minimal assistance General ADL Comments: requires assist for balance.     Mobility  Overal bed mobility: Modified Independent Bed Mobility: Supine to  Sit, Sit to Supine Supine to sit: Modified independent (Device/Increase time) Sit to supine: Modified independent (Device/Increase time) General bed mobility comments: No assistance needed to move in and out of bed.      Transfers  Overall transfer level: Needs assistance Equipment used: Rolling walker (2 wheeled), None Transfers: Sit to/from Stand Sit to Stand: Min guard Stand pivot transfers: Min guard General transfer comment: min guard for safety, cues for hand placement as patient pulls on RW to achieve standing.      Ambulation / Gait / Stairs / Wheelchair Mobility  Ambulation/Gait Ambulation/Gait assistance: Counsellor (Feet): 150 Feet Assistive device: Rolling walker (2 wheeled) Gait Pattern/deviations: Step-through pattern, Decreased stride length, Drifts right/left, Trunk flexed General Gait Details: Pt continues to present with an unsteady gait pattern and required cues for RW safety, increased posture/upper trunk control, and cues for pacing.  Pt remains heavily reliant of RW and was not using a device at baseline.   Gait velocity: Decreased     Posture / Balance Dynamic Sitting Balance Sitting balance - Comments: close guarding to min A when leaning forward to don sock  Balance Overall balance assessment: Needs assistance Sitting-balance support: Feet supported Sitting balance-Leahy Scale: Good Sitting balance - Comments: close guarding to min A when leaning forward to don sock  Standing balance support: During functional activity Standing balance-Leahy Scale: Fair Standing balance comment: Reliant on BUE support     Special needs/care consideration BiPAP/CPAP n/a CPM n/a Continuous Drip IV n/a Dialysis n/a Life Vest n/a Oxygen n/a Special Bed n/a Trach Size n/a Wound Vac n/a Skin intact Bowel mgmt: LBM 12/13 continent Bladder mgmt: continent Diabetic mgmt Hgb A1c 6.3 Loop recorder to be placed 12/16 before admit   Previous Home  Environment Living Arrangements: Alone  Lives With: Alone Available Help at Discharge: Family, Available PRN/intermittently Type of Home: House Home Layout: One level Home Access: Stairs to enter [employer]  of Steps: 2 Bathroom Shower/Tub: Optometrist: Yes How Accessible: Accessible via walker Home Care Services: No Additional Comments: Pt has a cat and dog for whom he is reponsible   Discharge Living Setting Plans for Discharge Living Setting: Patient's home, Alone Type of Home at Discharge: House Discharge Home Layout: One level Discharge Home Access: Stairs to enter Entrance Stairs-Rails: None Entrance Stairs-Number of Steps: 2 Discharge Bathroom Shower/Tub: Tub/shower unit Discharge Bathroom Toilet: Standard Discharge Bathroom Accessibility: Yes How Accessible: Accessible via walker Does the patient have any problems obtaining your medications?: No  Social/Family/Support Systems Patient Roles: Parent(has one local son who works for [employer] as a Dealer) Sport and exercise psychologist Information: son Anticipated Caregiver: son prn Anticipated Ambulance person Information: see above Ability/Limitations of Caregiver: son works Careers adviser: Intermittent Discharge Plan Discussed with Primary Caregiver: Yes Is Caregiver In Agreement with Plan?: Yes Does Caregiver/Family have Issues with Lodging/Transportation while Pt is in Rehab?: No  Goals/Additional Needs Patient/Family Goal for Rehab: Mod I to supervision with PT and OT Expected length of stay: ELOS 10 to 14 days Additional Information: Patietn's Dog and cat are boarding at his vet Pt/Family Agrees to Admission and willing to participate: Yes Program Orientation Provided & Reviewed with Pt/Caregiver Including Roles  & Responsibilities: Yes  Decrease burden of Care through IP rehab admission: n/a  Possible need for SNF placement upon discharge: not  anticipated  Patient Condition: This patient's medical and functional status has changed since the consult dated: 01/20/2018 in which the Rehabilitation Physician determined and documented that the patient's condition is appropriate for intensive rehabilitative care in an inpatient rehabilitation facility. See "History of Present Illness" (above) for medical update. Functional changes are: overall min guard. Patient's medical and functional status update has been discussed with the Rehabilitation physician and patient remains appropriate for inpatient rehabilitation. Will admit to inpatient rehab today.  Preadmission Screen Completed By:  Cleatrice Burke, 01/23/2018 1:56 PM ______________________________________________________________________   Discussed status with Dr. Posey Pronto on 01/23/2018 at  1404 and received telephone approval for admission today.  Admission Coordinator:  Cleatrice Burke, time 0630 Date 01/23/2018       Revision History

## 2018-01-23 NOTE — Consult Note (Addendum)
ELECTROPHYSIOLOGY CONSULT NOTE  Patient ID: Ricky Bright. MRN: [id number], DOB/AGE: Nov 27, 1955   Admit date: 01/19/2018 Date of Consult: 01/23/2018  Primary Physician: Bing Neighbors, [name] Primary Cardiologist: none Reason for Consultation: Cryptogenic stroke ; recommendations regarding Implantable Loop Recorder, requested by Dr. Roda Shutters  History of Present Illness Ricky Bright. was admitted on 01/19/2018 with stroke.   PMHx HTN, HLD, DM.   They first developed symptoms of left sided weakness, mentions a mechanical fall with head trauma a couple weeks ago that left him confused for a "little while" but better.  Did fine until a couple weks later where he started having weakness Imaging demonstrated multifocal posterior circulation infarcts involving bilateral cerebellum PICA distribution and the right PCA including right occipital and thalamus.  he has undergone workup for stroke including echocardiogram and carotid dopplers.  The patient has been monitored on telemetry which has demonstrated sinus rhythm with no arrhythmias.  Inpatient stroke work-up is to be completed with a TEE.   Echocardiogram this admission demonstrated  Transthoracic Echocardiogram  01/21/2018 Study Conclusions - Left ventricle: The cavity size was normal. Wall thickness was increased in a pattern of mild LVH. Systolic function was normal. Although no diagnostic regional wall motion abnormality was identified, this possibility cannot be completely excluded on the basis of this study. Doppler parameters are consistent with abnormal left ventricular relaxation (grade 1 diastolic dysfunction). - Ventricular septum: Abnormal septal motion likely secondary to conduction delay. - Aortic valve: Trileaflet; mildly thickened leaflets. Transvalvular velocity was within the normal range. There was no stenosis. There was trivial regurgitation. Mean gradient (S): 5 mm Hg. Valve area  (VTI): 3.29 cm^2. Valve area (Vmax): 2.67 cm^2. Valve area (Vmean): 2.7 cm^2. - Aortic root: The aortic root was dilated, measuring 49 mm. - Ascending aorta: The ascending aorta was dilated, measuring 43 mm in the proximal segment. - Mitral valve: Mildly calcified annulus. Chordal calcification. Transvalvular velocity was within the normal range. There was no evidence for stenosis. There was trivial regurgitation. - Left atrium: The atrium was normal in size. - Right ventricle: The cavity size was normal. Wall thickness was normal. Systolic function was normal. RV systolic pressure (S, est): 14 mm Hg. - Right atrium: Central venous pressure (est): 8 mm Hg. - Tricuspid valve: There was trivial regurgitation. - Inferior vena cava: The vessel was normal in size. The respirophasic diameter changes were blunted (<50%). - Pericardium, extracardiac: There was no pericardial effusion. Impressions: - No cardiac source of embolism was identified, but cannot be ruled out on the basis of this examination due to image quality. Recommendations: Recommend CT angiogram of the aorta (ECG gated or flash scanned) to evaluate dilated ascending aorta.   Lab work is reviewed.  Prior to admission, the patient denies chest pain, shortness of breath, dizziness, or syncope.  He does every few/several months get a fleeting feeling of some quick/fast beats that last moments only.   He is recovering from their stroke with plans to CIR at discharge.       Past Medical History:  Diagnosis Date  . Diabetes mellitus without complication (HCC)   . HLD (hyperlipidemia)   . Hypercholesteremia   . Hypertension      Surgical History:  Past Surgical History:  Procedure Laterality Date  . APPENDECTOMY    . bicep tendon rupture.       Medications Prior to Admission  Medication Sig Dispense Refill Last Dose  . hydroxypropyl methylcellulose / hypromellose (ISOPTO TEARS /  GONIOVISC) 2.5 %  ophthalmic solution Place 1 drop into both eyes daily as needed for dry eyes.   UNk  . Menthol, Topical Analgesic, (ICY HOT EX) Apply 1 application topically daily as needed (muscle pain).   UNK    Inpatient Medications:  . [MAR Hold] amLODipine  10 mg Oral Daily  . [MAR Hold] aspirin EC  81 mg Oral Daily  . [MAR Hold] atorvastatin  80 mg Oral q1800  . [MAR Hold] clopidogrel  75 mg Oral Daily  . [MAR Hold] enoxaparin (LOVENOX) injection  40 mg Subcutaneous Q24H  . [MAR Hold] insulin aspart  0-9 Units Subcutaneous TID WC  . [MAR Hold] lisinopril  10 mg Oral BID    Allergies:  Allergies  Allergen Reactions  . Trazodone And Nefazodone Shortness Of Breath    Social History   Socioeconomic History  . Marital status: Unknown    Spouse name: Not on file  . Number of children: Not on file  . Years of education: Not on file  . Highest education level: Not on file  Occupational History  . Not on file  Social Needs  . Financial resource strain: Not on file  . Food insecurity:    Worry: Not on file    Inability: Not on file  . Transportation needs:    Medical: Not on file    Non-medical: Not on file  Tobacco Use  . Smoking status: Never Smoker  . Smokeless tobacco: Never Used  Substance and Sexual Activity  . Alcohol use: Not Currently  . Drug use: Never  . Sexual activity: Not on file  Lifestyle  . Physical activity:    Days per week: Not on file    Minutes per session: Not on file  . Stress: Not on file  Relationships  . Social connections:    Talks on phone: Not on file    Gets together: Not on file    Attends religious service: Not on file    Active member of club or organization: Not on file    Attends meetings of clubs or organizations: Not on file    Relationship status: Not on file  . Intimate partner violence:    Fear of current or ex partner: Not on file    Emotionally abused: Not on file    Physically abused: Not on file    Forced sexual activity: Not on  file  Other Topics Concern  . Not on file  Social History Narrative  . Not on file     Family History  Problem Relation Age of Onset  . Stroke Father   . Stroke Paternal Grandfather       Review of Systems: All other systems reviewed and are otherwise negative except as noted above.  Physical Exam: Vitals:   01/22/18 1621 01/22/18 2018 01/23/18 0453 01/23/18 0754  BP: (!) 165/89 137/79 140/76 (!) 180/88  Pulse: 62 60 (!) 51 (!) 57  Resp: 20 20 20 16   Temp: 98.9 F (37.2 C) 98.5 F (36.9 C) 98.2 F (36.8 C) 98.3 F (36.8 C)  TempSrc: Oral Oral Oral Oral  SpO2: 97% 100% 99% 98%  Weight:      Height:        GEN- The patient is well appearing, alert and oriented x 3 today.   Head- normocephalic, atraumatic Eyes-  Sclera clear, conjunctiva pink Ears- hearing intact Oropharynx- clear Neck- supple Lungs- CTA b/l, normal work of breathing Heart- RRR, no murmurs, rubs or gallops  [place] A is widely patent. Left carotid system: Common carotid artery widely patent to the bifurcation. There is soft and calcified plaque at the bifurcation and ICA bulb but no stenosis. Cervical ICA is widely patent. Vertebral arteries: Right vertebral artery origin shows 30% stenosis. Beyond that, the vessel shows diminishing flow, without opacification in the upper cervical region, consistent with recent occlusion. The left vertebral artery arises from the arch as mentioned before and is widely patent through the neck to the foramen magnum. Skeleton: Ordinary cervical spondylosis. Chronic fusion in the upper cervical region. Other neck: No soft tissue mass or lymphadenopathy. Vocal fold paresis on the right. Upper chest: Mild pleural and parenchymal scarring at the apices. Calcified granuloma posteriorly in the  right upper lobe. Review of the MIP images confirms the above findings CTA HEAD FINDINGS Anterior circulation: Both internal carotid arteries are patent through the skull base and siphon regions. There is atherosclerotic calcification in the carotid siphon regions. There is stenosis in the supraclinoid internal carotid artery on both sides, estimated at 70-80%. The anterior and middle cerebral vessels do show flow without more distal stenosis. Posterior circulation: As noted above, the right vertebral artery is occluded at tAshok CorMarland Kitchendiaamen magTheMarland Kitchend3NederlandKenneynArmc Behavioral Health CenJamaicIleene Hutchinsonation Of ScottsMarland KitchendalePePalestine Yana HMartha Jefferson HJamaicIleene HutchinsonMarland KitchenlAshAshok CorMarland KitchendiaGolden [id number] HFairbanks Memorial Hospitalmore VaAshok CorMarland KMarland KitchGravetteitchGoBradley County Medical CenJamaicIleene Hutchinsontral Flor<BADTEXTAshok CorMarland Kitchendia BehavioThedora Hinders Left  vertebral artery is patent through the foramen magnum. There is severe stenosis of the V4 segment, 80% or greater, but the vessel does retain flow, supplying the basilar and giving retrograde flow in the distal right vertebral. There is a proximal basilar fenestration. No basilar stenosis. Superior cerebellar and posterior cerebral vessels show flow. Venous sinuses: Patent and normal. Anatomic variants: None other significant. Delayed phase: No abnormal enhancement. Review of the MIP images confirms the above findings IMPRESSION: 1. Right vertebral artery occluded at the upper cervical region to foramen magnum level. This is probably a recent occlusion and explains the embolic disease within the posterior circulation branches. 2. 80% or greater stenosis of the left V4 segment. 3. Atherosclerotic disease at both carotid bifurcations but no stenosis. 4. Atherosclerotic disease in both carotid siphon regions. Stenosis in the supraclinoid internal carotid arteries estimated at 70-80% bilaterally. Electronically Signed   By: Paulina Fusi M.D.   On: 01/19/2018 19:31    Ct Head Wo Contrast Result Date: 01/19/2018 CLINICAL DATA:  Lightheadedness and dizziness today. The patient suffered a fall 2 weeks ago with a blow to the back of the head. Initial encounter. EXAM: CT HEAD WITHOUT CONTRAST TECHNIQUE: Contiguous axial images were obtained  from the base of the skull through the vertex without intravenous contrast. COMPARISON:  None. FINDINGS: Brain: No evidence of acute infarction, hemorrhage, hydrocephalus, extra-axial collection or mass lesion/mass effect. There is some atrophy and chronic microvascular ischemic change. Remote right basal ganglia infarct noted. Vascular: No hyperdense vessel or unexpected calcification. Skull: Normal. Negative for fracture or focal lesion. Sinuses/Orbits: No acute abnormality. Scattered ethmoid air cell disease on the right and mild mucosal thickening in the maxillary sinuses noted. Other: None. IMPRESSION: No acute abnormality. Mild cortical atrophy and chronic microvascular ischemic change. Mild sinus disease. Electronically Signed   By: Drusilla Kanner M.D.   On: 01/19/2018 14:20    Mr Brain W And Wo Contrast Result Date: 01/19/2018 CLINICAL DATA:  Dizziness onset 1 week ago. Also diabetes hyperlipidemia hypertension history. Recent falls. EXAM: MRI HEAD WITHOUT AND WITH CONTRAST TECHNIQUE: Multiplanar, multiecho pulse sequences of the brain and surrounding structures were obtained without and with intravenous contrast. CONTRAST:  10 mL Gadovist IV COMPARISON:  CT head 01/19/2018 FINDINGS: Brain: Multiple areas of acute/subacute infarct with restricted diffusion. Several areas show patchy enhancement suggesting subacute duration. Patchy areas of restricted diffusion right cerebellum with enhancement. Restricted diffusion in the right occipital pole also with mild enhancement. Small area of restricted diffusion in the right thalamus. Small area of enhancement in the right caudate consistent with subacute infarct. Small area of restricted diffusion in the left cerebellum without enhancement. Chronic infarct in the deep white matter on the right. This shows evidence of prior mild hemorrhage. Mild atrophy. Negative for mass lesion. Vascular: Normal arterial flow voids Skull and upper cervical spine: Negative  Sinuses/Orbits: Moderate mucosal edema paranasal sinuses. Normal orbit Other: None IMPRESSION: Multiple areas of acute/subacute infarct including the cerebellum bilaterally, right occipital lobe, right thalamus, and right caudate. Several of these show enhancement suggesting subacute duration. CTA head neck recommended for further vascular evaluation. Chronic hemorrhage in the deep white matter on the right. Mild atrophy. These results were called by telephone at the time of interpretation on 01/19/2018 at 5:37 pm to [facility] PA , who verbally acknowledged these results. Electronically Signed   By: Marlan Palau M.D.   On: 01/19/2018 17:38    12-lead ECG SR, IVCD All prior EKG's in EPIC reviewed with  no documented atrial fibrillation  Telemetry SR/SB  Assessment and Plan:  1. Cryptogenic stroke The patient presents with cryptogenic stroke.  The patient has a TEE planned for this AM.  I spoke at length with the patient about monitoring for afib with either a 30 day event monitor or an implantable loop recorder.  Risks, benefits, and alteratives to implantable loop recorder were discussed with the patient today.   At this time, the patient is very clear in their decision to proceed with implantable loop recorder pending TEE result   Wound care was reviewed with the patient (keep incision clean and dry for 3 days).  Wound check will be scheduled for the patient.  Please call with questions.   Renee Norberto Sorenson, PA-C 01/23/2018   EP Attending  Patient seen and examined. Agree with above. The patient has sustained a cryptogenic stroke. His TEE shows a PFO. He has had lower extremity dopplers which demonstrated no DVT. He will undergo insertion of an ILR for atrial fib monitoring.  Leonia Reeves.D.

## 2018-01-23 NOTE — Discharge Summary (Signed)
Physician Discharge Summary  Ricky Bright. ZOX:[id number] DOB: 01-17-1956 DOA: 01/19/2018  PCP: Bing Neighbors, [name]  Admit date: 01/19/2018 Discharge date: 01/23/2018  Admitted From: home Disposition:  CIR  Recommendations for Outpatient Follow-up:  1. Follow up with PCP in 1-2 weeks 2. Discharge to Rehab 3. Dual antiplatelet therapy for 3 weeks then aspirin  Home Health: none Equipment/Devices: none  Discharge Condition: stable CODE STATUS: Full code Diet recommendation: heart healthy  HPI: Per Dr. Gilford Silvius, Ricky Bright. is a 62 y.o. male with history of hypertension and diabetes mellitus type 2 hyperlipidemia who has not been taking his medications for last few months presents to the ER because of persistent ataxia on walking.  Patient states he fell off the truck 3 weeks ago and last 1 week has been having difficulty with balance when he is walking.  Denies any difficulty swallowing speaking or any visual symptoms.  Denies any weakness of the upper or lower extremities.  Since symptoms persisted patient came to the ER.  Hospital Course:  Principal Problem Acute CVA -MRI on admission showed multiple areas of acute/subacute infarct including the cerebellum bilaterally, right occipital lobe, right thalamus and right caudate, some of these being subacute.  MRI was followed by CT angiogram of the head and neck which showed right vertebral artery occluded at the upper cervical region to foramen magnum level, appearing to be a recent occlusion, 80% or greater stenosis of the left V4 segment and atherosclerotic disease at both carotid bifurcations with no stenosis.  Neurology was consulted and followed patient while hospitalized.  He underwent a 2D echo which showed normal EF, as well as a TEE. TEE did show a small PFO, LE doppler were negative for DVT. Neurology recommends dual antiplatelet therapy with aspirin and Plavix for the next 3 weeks, then aspirin alone.   Patient was also placed on a statin for hyperlipidemia.  Additional Problems Type 2 diabetes mellitus, controlled -Most recent A1c 6.3, showing control. Continue carb modified diet Hyperlipidemia -Total cholesterol is 228, LDL is 119.  Continue high-dose statin. Hypertension -resume home medications   Discharge Diagnoses:  Active Problems:   Acute CVA (cerebrovascular accident) (HCC)   Hypertensive urgency   Type 2 diabetes mellitus with vascular disease (HCC)   Hyperlipidemia   CVA (cerebral vascular accident) [facility])     Discharge Instructions   Allergies as of 01/23/2018      Reactions   Trazodone And Nefazodone Shortness Of Breath      Medication List    TAKE these medications   amLODipine 10 MG tablet Commonly known as:  NORVASC Take 1 tablet (10 mg total) by mouth daily. Start taking on:  January 24, 2018   aspirin 81 MG EC tablet Take 1 tablet (81 mg total) by mouth daily. Start taking on:  January 24, 2018   atorvastatin 80 MG tablet Commonly known as:  LIPITOR Take 1 tablet (80 mg total) by mouth daily at 6 PM.   clopidogrel 75 MG tablet Commonly known as:  PLAVIX Take 1 tablet (75 mg total) by mouth daily. Start taking on:  January 24, 2018   hydroxypropyl methylcellulose / hypromellose 2.5 % ophthalmic solution Commonly known as:  ISOPTO TEARS / GONIOVISC Place 1 drop into both eyes daily as needed for dry eyes.   ICY HOT EX Apply 1 application topically daily as needed (muscle pain).   lisinopril 10 MG tablet Commonly known as:  PRINIVIL,ZESTRIL Take 1 tablet (10 mg total)  by mouth daily.       Consultations:  Neurology   Procedures/Studies:  TEE Findings: Please see echo section for full report.  Normal LV size with mild to moderate LV hypertrophy.  EF 55-60%. Mild septal bounce noted suggesting LBBB.  Normal RV size and systolic function.  Normal left and right atrial sizes.  No LA appendage thrombus.  There was a small PFO noted by  color doppler and bubble study.  No significant tricuspid regurgitation.  Trivial MR.  Trileaflet aortic valve with trivial AI.  No aortic stenosis.  The aortic root was dilated to 4.4 cm, the ascending aorta measured 4.0 cm.  There was mild plaque in the descending thoracic aorta.     2D echo  Study Conclusions - Left ventricle: The cavity size was normal. Wall thickness was increased in a pattern of mild LVH. Systolic function was normal. Although no diagnostic regional wall motion abnormality was identified, this possibility cannot be completely excluded on the basis of this study. Doppler parameters are consistent with abnormal left ventricular relaxation (grade 1 diastolic dysfunction). - Ventricular septum: Abnormal septal motion likely secondary to conduction delay. - Aortic valve: Trileaflet; mildly thickened leaflets. Transvalvular velocity was within the normal range. There was no stenosis. There was trivial regurgitation. Mean gradient (S): 5 mm Hg. Valve area (VTI): 3.29 cm^2. Valve area (Vmax): 2.67 cm^2. Valve area (Vmean): 2.7 cm^2. - Aortic root: The aortic root was dilated, measuring 49 mm. - Ascending aorta: The ascending aorta was dilated, measuring 43 mm in the proximal segment. - Mitral valve: Mildly calcified annulus. Chordal calcification. Transvalvular velocity was within the normal range. There was no evidence for stenosis. There was trivial regurgitation. - Left atrium: The atrium was normal in size. - Right ventricle: The cavity size was normal. Wall thickness was normal. Systolic function was normal. RV systolic pressure (S, est): 14 mm Hg. - Right atrium: Central venous pressure (est): 8 mm Hg. - Tricuspid valve: There was trivial regurgitation. - Inferior vena cava: The vessel was normal in size. The respirophasic diameter changes were blunted (< 50%). - Pericardium, extracardiac: There was no pericardial effusion.  Ct Angio Head W Or Wo Contrast  Result Date:  01/19/2018 CLINICAL DATA:  Larey Seat 2 weeks ago. Acute stroke presentation today with multiple posterior circulation infarctions. EXAM: CT ANGIOGRAPHY HEAD AND NECK TECHNIQUE: Multidetector CT imaging of the head and neck was performed using the standard protocol during bolus administration of intravenous contrast. Multiplanar CT image reconstructions and MIPs were obtained to evaluate the vascular anatomy. Carotid stenosis measurements (when applicable) are obtained utilizing NASCET criteria, using the distal internal carotid diameter as the denominator. CONTRAST:  75mL ISOVUE-370 IOPAMIDOL (ISOVUE-370) INJECTION 76% COMPARISON:  CT and MRI same day. FINDINGS: CTA NECK FINDINGS Aortic arch: Aortic atherosclerosis. No dissection. Branching pattern is normal with the variation of the left vertebral artery arising directly from the arch. No origin stenosis. Right carotid system: Common carotid artery widely patent to the bifurcation. Soft and calcified plaque at the carotid bifurcation and ICA bulb but no stenosis. Cervical ICA is widely patent. Left carotid system: Common carotid artery widely patent to the bifurcation. There is soft and calcified plaque at the bifurcation and ICA bulb but no stenosis. Cervical ICA is widely patent. Vertebral arteries: Right vertebral artery origin shows 30% stenosis. Beyond that, the vessel shows diminishing flow, without opacification in the upper cervical region, consistent with recent occlusion. The left vertebral artery arises from the arch as mentioned before and  is widely patent through the neck to the foramen magnum. Skeleton: Ordinary cervical spondylosis. Chronic fusion in the upper cervical region. Other neck: No soft tissue mass or lymphadenopathy. Vocal fold paresis on the right. Upper chest: Mild pleural and parenchymal scarring at the apices. Calcified granuloma posteriorly in the right upper lobe. Review of the MIP images confirms the above findings CTA HEAD FINDINGS  Anterior circulation: Both internal carotid arteries are patent through the skull base and siphon regions. There is atherosclerotic calcification in the carotid siphon regions. There is stenosis in the supraclinoid internal carotid artery on both sides, estimated at 70-80%. The anterior and middle cerebral vessels do show flow without more distal stenosis. Posterior circulation: As noted above, the right vertebral artery is occluded at the foramen magnum. This is probably a recent occlusion. Left vertebral artery is patent through the foramen magnum. There is severe stenosis of the V4 segment, 80% or greater, but the vessel does retain flow, supplying the basilar and giving retrograde flow in the distal right vertebral. There is a proximal basilar fenestration. No basilar stenosis. Superior cerebellar and posterior cerebral vessels show flow. Venous sinuses: Patent and normal. Anatomic variants: None other significant. Delayed phase: No abnormal enhancement. Review of the MIP images confirms the above findings IMPRESSION: 1. Right vertebral artery occluded at the upper cervical region to foramen magnum level. This is probably a recent occlusion and explains the embolic disease within the posterior circulation branches. 2. 80% or greater stenosis of the left V4 segment. 3. Atherosclerotic disease at both carotid bifurcations but no stenosis. 4. Atherosclerotic disease in both carotid siphon regions. Stenosis in the supraclinoid internal carotid arteries estimated at 70-80% bilaterally. Electronically Signed   By: Paulina Fusi M.D.   On: 01/19/2018 19:31   Ct Head Wo Contrast  Result Date: 01/19/2018 CLINICAL DATA:  Lightheadedness and dizziness today. The patient suffered a fall 2 weeks ago with a blow to the back of the head. Initial encounter. EXAM: CT HEAD WITHOUT CONTRAST TECHNIQUE: Contiguous axial images were obtained from the base of the skull through the vertex without intravenous contrast. COMPARISON:   None. FINDINGS: Brain: No evidence of acute infarction, hemorrhage, hydrocephalus, extra-axial collection or mass lesion/mass effect. There is some atrophy and chronic microvascular ischemic change. Remote right basal ganglia infarct noted. Vascular: No hyperdense vessel or unexpected calcification. Skull: Normal. Negative for fracture or focal lesion. Sinuses/Orbits: No acute abnormality. Scattered ethmoid air cell disease on the right and mild mucosal thickening in the maxillary sinuses noted. Other: None. IMPRESSION: No acute abnormality. Mild cortical atrophy and chronic microvascular ischemic change. Mild sinus disease. Electronically Signed   By: Drusilla Kanner M.D.   On: 01/19/2018 14:20   Ct Angio Neck W And/or Wo Contrast  Result Date: 01/19/2018 CLINICAL DATA:  Larey Seat 2 weeks ago. Acute stroke presentation today with multiple posterior circulation infarctions. EXAM: CT ANGIOGRAPHY HEAD AND NECK TECHNIQUE: Multidetector CT imaging of the head and neck was performed using the standard protocol during bolus administration of intravenous contrast. Multiplanar CT image reconstructions and MIPs were obtained to evaluate the vascular anatomy. Carotid stenosis measurements (when applicable) are obtained utilizing NASCET criteria, using the distal internal carotid diameter as the denominator. CONTRAST:  75mL ISOVUE-370 IOPAMIDOL (ISOVUE-370) INJECTION 76% COMPARISON:  CT and MRI same day. FINDINGS: CTA NECK FINDINGS Aortic arch: Aortic atherosclerosis. No dissection. Branching pattern is normal with the variation of the left vertebral artery arising directly from the arch. No origin stenosis. Right carotid system: Common carotid  artery widely patent to the bifurcation. Soft and calcified plaque at the carotid bifurcation and ICA bulb but no stenosis. Cervical ICA is widely patent. Left carotid system: Common carotid artery widely patent to the bifurcation. There is soft and calcified plaque at the bifurcation  and ICA bulb but no stenosis. Cervical ICA is widely patent. Vertebral arteries: Right vertebral artery origin shows 30% stenosis. Beyond that, the vessel shows diminishing flow, without opacification in the upper cervical region, consistent with recent occlusion. The left vertebral artery arises from the arch as mentioned before and is widely patent through the neck to the foramen magnum. Skeleton: Ordinary cervical spondylosis. Chronic fusion in the upper cervical region. Other neck: No soft tissue mass or lymphadenopathy. Vocal fold paresis on the right. Upper chest: Mild pleural and parenchymal scarring at the apices. Calcified granuloma posteriorly in the right upper lobe. Review of the MIP images confirms the above findings CTA HEAD FINDINGS Anterior circulation: Both internal carotid arteries are patent through the skull base and siphon regions. There is atherosclerotic calcification in the carotid siphon regions. There is stenosis in the supraclinoid internal carotid artery on both sides, estimated at 70-80%. The anterior and middle cerebral vessels do show flow without more distal stenosis. Posterior circulation: As noted above, the right vertebral artery is occluded at the foramen magnum. This is probably a recent occlusion. Left vertebral artery is patent through the foramen magnum. There is severe stenosis of the V4 segment, 80% or greater, but the vessel does retain flow, supplying the basilar and giving retrograde flow in the distal right vertebral. There is a proximal basilar fenestration. No basilar stenosis. Superior cerebellar and posterior cerebral vessels show flow. Venous sinuses: Patent and normal. Anatomic variants: None other significant. Delayed phase: No abnormal enhancement. Review of the MIP images confirms the above findings IMPRESSION: 1. Right vertebral artery occluded at the upper cervical region to foramen magnum level. This is probably a recent occlusion and explains the embolic  disease within the posterior circulation branches. 2. 80% or greater stenosis of the left V4 segment. 3. Atherosclerotic disease at both carotid bifurcations but no stenosis. 4. Atherosclerotic disease in both carotid siphon regions. Stenosis in the supraclinoid internal carotid arteries estimated at 70-80% bilaterally. Electronically Signed   By: Paulina Fusi M.D.   On: 01/19/2018 19:31   [name] And Wo Contrast  Result Date: 01/19/2018 CLINICAL DATA:  Dizziness onset 1 week ago. Also diabetes hyperlipidemia hypertension history. Recent falls. EXAM: MRI HEAD WITHOUT AND WITH CONTRAST TECHNIQUE: Multiplanar, multiecho pulse sequences of the brain and surrounding structures were obtained without and with intravenous contrast. CONTRAST:  10 mL Gadovist IV COMPARISON:  CT head 01/19/2018 FINDINGS: Brain: Multiple areas of acute/subacute infarct with restricted diffusion. Several areas show patchy enhancement suggesting subacute duration. Patchy areas of restricted diffusion right cerebellum with enhancement. Restricted diffusion in the right occipital pole also with mild enhancement. Small area of restricted diffusion in the right thalamus. Small area of enhancement in the right caudate consistent with subacute infarct. Small area of restricted diffusion in the left cerebellum without enhancement. Chronic infarct in the deep white matter on the right. This shows evidence of prior mild hemorrhage. Mild atrophy. Negative for mass lesion. Vascular: Normal arterial flow voids Skull and upper cervical spine: Negative Sinuses/Orbits: Moderate mucosal edema paranasal sinuses. Normal orbit Other: None IMPRESSION: Multiple areas of acute/subacute infarct including the cerebellum bilaterally, right occipital lobe, right thalamus, and right caudate. Several of these show enhancement suggesting subacute  duration. CTA head neck recommended for further vascular evaluation. Chronic hemorrhage in the deep white matter on the  right. Mild atrophy. These results were called by telephone at the time of interpretation on 01/19/2018 at 5:37 pm to [facility] , who verbally acknowledged these results. Electronically Signed   By: Marlan Palauharles  Clark M.D.   On: 01/19/2018 17:38      Subjective: - no chest pain, shortness of breath, no abdominal pain, nausea or vomiting.   Discharge Exam: Vitals:   01/23/18 1054 01/23/18 1137  BP: (!) 145/84 (!) 142/90  Pulse: 64 (!) 58  Resp: 18 16  Temp:  98.3 F (36.8 C)  SpO2: 93% 95%    General: Pt is alert, awake, not in acute distress Cardiovascular: RRR, S1/S2 +, no rubs, no gallops Respiratory: CTA bilaterally, no wheezing, no rhonchi Abdominal: Soft, NT, ND, bowel sounds + Extremities: no edema, no cyanosis    The results of significant diagnostics from this hospitalization (including imaging, microbiology, ancillary and laboratory) are listed below for reference.     Microbiology: No results found for this or any previous visit (from the past 240 hour(s)).   Labs: BNP (last 3 results) No results for input(s): BNP in the last 8760 hours. Basic Metabolic Panel: Recent Labs  Lab 01/19/18 1316 01/20/18 0530 01/21/18 0420  NA 136 139 138  K 3.5 3.3* 3.6  CL 100 104 104  CO2 20* 22 22  GLUCOSE 156* 122* 124*  BUN 13 10 6*  CREATININE 1.14 0.91 1.01  CALCIUM 9.6 8.8* 8.8*   Liver Function Tests: Recent Labs  Lab 01/20/18 0530  AST 16  ALT 13  ALKPHOS 54  BILITOT 1.2  PROT 6.6  ALBUMIN 3.4*   No results for input(s): LIPASE, AMYLASE in the last 168 hours. No results for input(s): AMMONIA in the last 168 hours. CBC: Recent Labs  Lab 01/19/18 1316 01/20/18 0530 01/21/18 0420  WBC 8.1 6.2 6.3  NEUTROABS 5.5  --   --   HGB 18.1* 16.1 15.4  HCT 54.2* 46.4 46.1  MCV 88.3 88.0 89.3  PLT 310 259 254   Cardiac Enzymes: No results for input(s): CKTOTAL, CKMB, CKMBINDEX, TROPONINI in the last 168 hours. BNP: Invalid input(s):  POCBNP CBG: Recent Labs  Lab 01/22/18 1135 01/22/18 1619 01/22/18 2153 01/23/18 0848 01/23/18 1134  GLUCAP 143* 148* 128* 123* 119*   D-Dimer No results for input(s): DDIMER in the last 72 hours. Hgb A1c No results for input(s): HGBA1C in the last 72 hours. Lipid Profile No results for input(s): CHOL, HDL, LDLCALC, TRIG, CHOLHDL, LDLDIRECT in the last 72 hours. Thyroid function studies No results for input(s): TSH, T4TOTAL, T3FREE, THYROIDAB in the last 72 hours.  Invalid input(s): FREET3 Anemia work up No results for input(s): VITAMINB12, FOLATE, FERRITIN, TIBC, IRON, RETICCTPCT in the last 72 hours. Urinalysis No results found for: COLORURINE, APPEARANCEUR, LABSPEC, PHURINE, GLUCOSEU, HGBUR, BILIRUBINUR, KETONESUR, PROTEINUR, UROBILINOGEN, NITRITE, LEUKOCYTESUR Sepsis Labs Invalid input(s): PROCALCITONIN,  WBC,  LACTICIDVEN   Time coordinating discharge: 40 minutes  SIGNED:  Pamella Pertostin Gherghe, MD  [facility] 01/23/2018, 12:58 PM Pager [phone]  If 7PM-7AM, please contact night-coverage [email or web address] Password [email or web address]

## 2018-01-23 NOTE — Progress Notes (Signed)
Admit to unit via w/c, oriented to unit, schedule and plan of care. Reviewed medications and treatment plan, diet and therapy schedule. States an understanding of information reviewed. Pamelia HoitSharp, Loletha Bertini B

## 2018-01-23 NOTE — Progress Notes (Signed)
Report given to RN Archie Pattenonya on unit [address] [place], [facility]. Pt to discharge to CIR, room 4W20.  Pt s/p loop recorder placement this evening and delay in discharging to CIR until after loop recorder placed and pt had no post procedure complications. VSS. Waiting for dinner tray to arrive.

## 2018-01-23 NOTE — Progress Notes (Signed)
Ricky Oyster, MD  Physician  Physical Medicine and Rehabilitation  Consult Note  Signed  Date of Service:  01/20/2018 1:39 PM       Related encounter: ED to Hosp-Admission (Current) from 01/19/2018 in [place] [facility]      Signed      Expand All Collapse All    Show:Clear all [x] Manual[x] Template[] Copied  Added by: [x] Angiulli, Mcarthur Rossetti, PA-C[x] Ricky Oyster, MD  [] Hover for details      Physical Medicine and Rehabilitation Consult Reason for Consult: Decreased functional mobility with persistent ataxia Referring Physician: [facility]   HPI: Ricky Bright. is a 62 y.o.right handed male  with history of hypertension, hyperlipidemia, diabetes mellitus on no prescription medications. Per chart review patient lives alone. Independent prior to admission. Retired Hotel manager. One level home with 2 steps to entry. Son works maintenance at [employer]. No other local family. Presented 01/19/2018 with persistent ataxia. Patient states recent fall off a truck 3 weeks ago and has had balance difficulties since that time. Denied any swallowing, speech or visual deficits. MRI showed multiple areas of acute subacute infarction include the cerebellum bilaterally right occipital lobe, right thalamus and right caudate. CT angiogram of head and neck right vertebral artery occluded at the upper cervical region to the foramen magnum level. 80% or greater stenosis of the left V4 segment. Patient did not receive TPA. Echocardiogram pending. Neurology consulted with workup presently ongoing. Currently maintained on aspirin and Plavix for CVA prophylaxis. Subcutaneous Lovenox for DVT prophylaxis. Tolerating a regular consistency diet. Occupational therapy evaluation completed with recommendations of physical medicine rehabilitation consult.   Review of Systems  Constitutional: Negative for chills and fever.  HENT: Negative for hearing loss.   Eyes: Negative for  blurred vision and double vision.  Respiratory: Negative for cough and shortness of breath.   Cardiovascular: Negative for chest pain, palpitations and leg swelling.  Gastrointestinal: Positive for constipation. Negative for nausea and vomiting.  Genitourinary: Negative for dysuria, flank pain and hematuria.  Musculoskeletal: Positive for falls.  Skin: Negative for rash.  Neurological: Positive for dizziness and focal weakness.  All other systems reviewed and are negative.      Past Medical History:  Diagnosis Date  . Diabetes mellitus without complication (HCC)   . HLD (hyperlipidemia)   . Hypercholesteremia   . Hypertension         Past Surgical History:  Procedure Laterality Date  . APPENDECTOMY    . bicep tendon rupture.          Family History  Problem Relation Age of Onset  . Stroke Father   . Stroke Paternal Grandfather    Social History:  reports that he has never smoked. He has never used smokeless tobacco. He reports previous alcohol use. He reports that he does not use drugs. Allergies: No Known Allergies No medications prior to admission.    Home: Home Living Family/patient expects to be discharged to:: Private residence Living Arrangements: Alone Available Help at Discharge: Family, Available PRN/intermittently Type of Home: House Home Access: Stairs to enter Secretary/administrator of Steps: 2 Home Layout: One level Bathroom Shower/Tub: Engineer, manufacturing systems: Standard Home Equipment: None Additional Comments: Pt has a cat and dog for whom his is reponsible   Functional History: Prior Function Level of Independence: Independent Comments: Pt was fully independent prior to fall/onset of symptoms.  Since that time, he has had multiple falls and has stopped driving due to feeling insecure.  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  requires assist to steady   ADL: ADL Overall ADL's : Needs assistance/impaired Eating/Feeding: Independent Grooming: Wash/dry hands, Wash/dry face, Oral care, Brushing hair, Minimal assistance, Standing Upper Body Bathing: Supervision/ safety, Set up, Sitting Lower Body Bathing: Minimal assistance, Sit to/from stand Upper Body Dressing : Set up, Sitting Lower Body Dressing: Moderate assistance, Sit to/from stand Lower Body Dressing Details (indicate cue type and reason): difficulty accessing feet for LB ADLs and due to balance  Toilet Transfer: Minimal assistance, Ambulation, Comfort height toilet Toileting- Clothing Manipulation and Hygiene: Minimal assistance, Sit to/from stand Functional mobility during ADLs: Minimal assistance, Rolling walker General ADL Comments: Pt requires assist for balance   Cognition: Cognition Overall Cognitive Status: Impaired/Different from baseline Orientation Level: Oriented X4 Cognition Arousal/Alertness: Awake/alert Behavior During Therapy: WFL for tasks assessed/performed Overall Cognitive Status: Impaired/Different from baseline Area of Impairment: Memory Memory: Decreased short-term memory General Comments: Pt noted to frequently repeat himself   Blood pressure (!) 167/98, pulse 84, temperature 98.4 F (36.9 C), temperature source Oral, resp. rate 18, height 6' (1.829 m), weight 97.7 kg, SpO2 99 %. Physical Exam  Neurological:  Patient is alert in no acute distress. Speech is fluent. Follows full commands. Fair awareness of deficits.    LabResultsLast24Hours  Results for orders placed  or performed during the hospital encounter of 01/19/18 (from the past 24 hour(s))  I-Stat Troponin, ED (not at [facility])     Status: None   Collection Time: 01/19/18  1:42 PM  Result Value Ref Range   Troponin i, poc 0.02 0.00 - 0.08 ng/mL   Comment 3          HIV antibody (Routine Testing)     Status: None   Collection Time: 01/20/18  5:30 AM  Result Value Ref Range   HIV Screen 4th Generation wRfx Non Reactive Non Reactive  Hemoglobin A1c     Status: Abnormal   Collection Time: 01/20/18  5:30 AM  Result Value Ref Range   Hgb A1c MFr Bld 6.3 (H) 4.8 - 5.6 %   Mean Plasma Glucose 134.11 mg/dL  Lipid panel     Status: Abnormal   Collection Time: 01/20/18  5:30 AM  Result Value Ref Range   Cholesterol 228 (H) 0 - 200 mg/dL   Triglycerides 161 <096 mg/dL   HDL 16 (L) >04 mg/dL   Total CHOL/HDL Ratio 14.3 RATIO   VLDL 22 0 - 40 mg/dL   LDL Cholesterol 540 (H) 0 - 99 mg/dL  Comprehensive metabolic panel     Status: Abnormal   Collection Time: 01/20/18  5:30 AM  Result Value Ref Range   Sodium 139 135 - 145 mmol/L   Potassium 3.3 (L) 3.5 - 5.1 mmol/L   Chloride 104 98 - 111 mmol/L   CO2 22 22 - 32 mmol/L   Glucose, Bld 122 (H) 70 - 99 mg/dL   BUN 10 8 - 23 mg/dL   Creatinine, Ser 9.81 0.61 - 1.24 mg/dL   Calcium 8.8 (L) 8.9 - 10.3 mg/dL   Total Protein 6.6 6.5 -  8.1 g/dL   Albumin 3.4 (L) 3.5 - 5.0 g/dL   AST 16 15 - 41 U/L   ALT 13 0 - 44 U/L   Alkaline Phosphatase 54 38 - 126 U/L   Total Bilirubin 1.2 0.3 - 1.2 mg/dL   GFR calc non Af Amer >60 >60 mL/min   GFR calc Af Amer >60 >60 mL/min   Anion gap 13 5 - 15  CBC     Status: None   Collection Time: 01/20/18  5:30 AM  Result Value Ref Range   WBC 6.2 4.0 - 10.5 K/uL   RBC 5.27 4.22 - 5.81 MIL/uL   Hemoglobin 16.1 13.0 - 17.0 g/dL   HCT 16.1 09.6 - 04.5 %   MCV 88.0 80.0 - 100.0 fL   MCH 30.6 26.0 - 34.0 pg   MCHC 34.7 30.0 - 36.0 g/dL   RDW 40.9 81.1 - 91.4 %   Platelets 259  150 - 400 K/uL   nRBC 0.0 0.0 - 0.2 %  Glucose, capillary     Status: Abnormal   Collection Time: 01/20/18  7:06 AM  Result Value Ref Range   Glucose-Capillary 102 (H) 70 - 99 mg/dL  Glucose, capillary     Status: Abnormal   Collection Time: 01/20/18 12:03 PM  Result Value Ref Range   Glucose-Capillary 143 (H) 70 - 99 mg/dL   Comment 1 Notify RN    Comment 2 Document in Chart       ImagingResults(Last48hours)  Ct Angio Head W Or Wo Contrast  Result Date: 01/19/2018 CLINICAL DATA:  Larey Seat 2 weeks ago. Acute stroke presentation today with multiple posterior circulation infarctions. EXAM: CT ANGIOGRAPHY HEAD AND NECK TECHNIQUE: Multidetector CT imaging of the head and neck was performed using the standard protocol during bolus administration of intravenous contrast. Multiplanar CT image reconstructions and MIPs were obtained to evaluate the vascular anatomy. Carotid stenosis measurements (when applicable) are obtained utilizing NASCET criteria, using the distal internal carotid diameter as the denominator. CONTRAST:  75mL ISOVUE-370 IOPAMIDOL (ISOVUE-370) INJECTION 76% COMPARISON:  CT and MRI same day. FINDINGS: CTA NECK FINDINGS Aortic arch: Aortic atherosclerosis. No dissection. Branching pattern is normal with the variation of the left vertebral artery arising directly from the arch. No origin stenosis. Right carotid system: Common carotid artery widely patent to the bifurcation. Soft and calcified plaque at the carotid bifurcation and ICA bulb but no stenosis. Cervical ICA is widely patent. Left carotid system: Common carotid artery widely patent to the bifurcation. There is soft and calcified plaque at the bifurcation and ICA bulb but no stenosis. Cervical ICA is widely patent. Vertebral arteries: Right vertebral artery origin shows 30% stenosis. Beyond that, the vessel shows diminishing flow, without opacification in the upper cervical region, consistent with recent occlusion. The  left vertebral artery arises from the arch as mentioned before and is widely patent through the neck to the foramen magnum. Skeleton: Ordinary cervical spondylosis. Chronic fusion in the upper cervical region. Other neck: No soft tissue mass or lymphadenopathy. Vocal fold paresis on the right. Upper chest: Mild pleural and parenchymal scarring at the apices. Calcified granuloma posteriorly in the right upper lobe. Review of the MIP images confirms the above findings CTA HEAD FINDINGS Anterior circulation: Both internal carotid arteries are patent through the skull base and siphon regions. There is atherosclerotic calcification in the carotid siphon regions. There is stenosis in the supraclinoid internal carotid artery on both sides, estimated at 70-80%. The anterior and middle cerebral vessels do  show flow without more distal stenosis. Posterior circulation: As noted above, the right vertebral artery is occluded at the foramen magnum. This is probably a recent occlusion. Left vertebral artery is patent through the foramen magnum. There is severe stenosis of the V4 segment, 80% or greater, but the vessel does retain flow, supplying the basilar and giving retrograde flow in the distal right vertebral. There is a proximal basilar fenestration. No basilar stenosis. Superior cerebellar and posterior cerebral vessels show flow. Venous sinuses: Patent and normal. Anatomic variants: None other significant. Delayed phase: No abnormal enhancement. Review of the MIP images confirms the above findings IMPRESSION: 1. Right vertebral artery occluded at the upper cervical region to foramen magnum level. This is probably a recent occlusion and explains the embolic disease within the posterior circulation branches. 2. 80% or greater stenosis of the left V4 segment. 3. Atherosclerotic disease at both carotid bifurcations but no stenosis. 4. Atherosclerotic disease in both carotid siphon regions. Stenosis in the supraclinoid internal  carotid arteries estimated at 70-80% bilaterally. Electronically Signed   By: Paulina FusiMark  Shogry M.D.   On: 01/19/2018 19:31   Ct Head Wo Contrast  Result Date: 01/19/2018 CLINICAL DATA:  Lightheadedness and dizziness today. The patient suffered a fall 2 weeks ago with a blow to the back of the head. Initial encounter. EXAM: CT HEAD WITHOUT CONTRAST TECHNIQUE: Contiguous axial images were obtained from the base of the skull through the vertex without intravenous contrast. COMPARISON:  None. FINDINGS: Brain: No evidence of acute infarction, hemorrhage, hydrocephalus, extra-axial collection or mass lesion/mass effect. There is some atrophy and chronic microvascular ischemic change. Remote right basal ganglia infarct noted. Vascular: No hyperdense vessel or unexpected calcification. Skull: Normal. Negative for fracture or focal lesion. Sinuses/Orbits: No acute abnormality. Scattered ethmoid air cell disease on the right and mild mucosal thickening in the maxillary sinuses noted. Other: None. IMPRESSION: No acute abnormality. Mild cortical atrophy and chronic microvascular ischemic change. Mild sinus disease. Electronically Signed   By: Drusilla Kannerhomas  Dalessio M.D.   On: 01/19/2018 14:20   Ct Angio Neck W And/or Wo Contrast  Result Date: 01/19/2018 CLINICAL DATA:  Larey SeatFell 2 weeks ago. Acute stroke presentation today with multiple posterior circulation infarctions. EXAM: CT ANGIOGRAPHY HEAD AND NECK TECHNIQUE: Multidetector CT imaging of the head and neck was performed using the standard protocol during bolus administration of intravenous contrast. Multiplanar CT image reconstructions and MIPs were obtained to evaluate the vascular anatomy. Carotid stenosis measurements (when applicable) are obtained utilizing NASCET criteria, using the distal internal carotid diameter as the denominator. CONTRAST:  75mL ISOVUE-370 IOPAMIDOL (ISOVUE-370) INJECTION 76% COMPARISON:  CT and MRI same day. FINDINGS: CTA NECK FINDINGS Aortic  arch: Aortic atherosclerosis. No dissection. Branching pattern is normal with the variation of the left vertebral artery arising directly from the arch. No origin stenosis. Right carotid system: Common carotid artery widely patent to the bifurcation. Soft and calcified plaque at the carotid bifurcation and ICA bulb but no stenosis. Cervical ICA is widely patent. Left carotid system: Common carotid artery widely patent to the bifurcation. There is soft and calcified plaque at the bifurcation and ICA bulb but no stenosis. Cervical ICA is widely patent. Vertebral arteries: Right vertebral artery origin shows 30% stenosis. Beyond that, the vessel shows diminishing flow, without opacification in the upper cervical region, consistent with recent occlusion. The left vertebral artery arises from the arch as mentioned before and is widely patent through the neck to the foramen magnum. Skeleton: Ordinary cervical spondylosis. Chronic fusion  in the upper cervical region. Other neck: No soft tissue mass or lymphadenopathy. Vocal fold paresis on the right. Upper chest: Mild pleural and parenchymal scarring at the apices. Calcified granuloma posteriorly in the right upper lobe. Review of the MIP images confirms the above findings CTA HEAD FINDINGS Anterior circulation: Both internal carotid arteries are patent through the skull base and siphon regions. There is atherosclerotic calcification in the carotid siphon regions. There is stenosis in the supraclinoid internal carotid artery on both sides, estimated at 70-80%. The anterior and middle cerebral vessels do show flow without more distal stenosis. Posterior circulation: As noted above, the right vertebral artery is occluded at the foramen magnum. This is probably a recent occlusion. Left vertebral artery is patent through the foramen magnum. There is severe stenosis of the V4 segment, 80% or greater, but the vessel does retain flow, supplying the basilar and giving retrograde  flow in the distal right vertebral. There is a proximal basilar fenestration. No basilar stenosis. Superior cerebellar and posterior cerebral vessels show flow. Venous sinuses: Patent and normal. Anatomic variants: None other significant. Delayed phase: No abnormal enhancement. Review of the MIP images confirms the above findings IMPRESSION: 1. Right vertebral artery occluded at the upper cervical region to foramen magnum level. This is probably a recent occlusion and explains the embolic disease within the posterior circulation branches. 2. 80% or greater stenosis of the left V4 segment. 3. Atherosclerotic disease at both carotid bifurcations but no stenosis. 4. Atherosclerotic disease in both carotid siphon regions. Stenosis in the supraclinoid internal carotid arteries estimated at 70-80% bilaterally. Electronically Signed   By: Paulina Fusi M.D.   On: 01/19/2018 19:31   [name] And Wo Contrast  Result Date: 01/19/2018 CLINICAL DATA:  Dizziness onset 1 week ago. Also diabetes hyperlipidemia hypertension history. Recent falls. EXAM: MRI HEAD WITHOUT AND WITH CONTRAST TECHNIQUE: Multiplanar, multiecho pulse sequences of the brain and surrounding structures were obtained without and with intravenous contrast. CONTRAST:  10 mL Gadovist IV COMPARISON:  CT head 01/19/2018 FINDINGS: Brain: Multiple areas of acute/subacute infarct with restricted diffusion. Several areas show patchy enhancement suggesting subacute duration. Patchy areas of restricted diffusion right cerebellum with enhancement. Restricted diffusion in the right occipital pole also with mild enhancement. Small area of restricted diffusion in the right thalamus. Small area of enhancement in the right caudate consistent with subacute infarct. Small area of restricted diffusion in the left cerebellum without enhancement. Chronic infarct in the deep white matter on the right. This shows evidence of prior mild hemorrhage. Mild atrophy. Negative for  mass lesion. Vascular: Normal arterial flow voids Skull and upper cervical spine: Negative Sinuses/Orbits: Moderate mucosal edema paranasal sinuses. Normal orbit Other: None IMPRESSION: Multiple areas of acute/subacute infarct including the cerebellum bilaterally, right occipital lobe, right thalamus, and right caudate. Several of these show enhancement suggesting subacute duration. CTA head neck recommended for further vascular evaluation. Chronic hemorrhage in the deep white matter on the right. Mild atrophy. These results were called by telephone at the time of interpretation on 01/19/2018 at 5:37 pm to [facility] PA , who verbally acknowledged these results. Electronically Signed   By: Marlan Palau M.D.   On: 01/19/2018 17:38      Assessment/Plan: Diagnosis: posterior circulation infarcts 1. Does the need for close, 24 hr/day medical supervision in concert with the patient's rehab needs make it unreasonable for this patient to be served in a less intensive setting? Yes 2. Co-Morbidities requiring supervision/potential complications:  HTN, diabetes  3. Due to bladder management, bowel management, safety, skin/wound care, disease management, medication administration, pain management and patient education, does the patient require 24 hr/day rehab nursing? Yes 4. Does the patient require coordinated care of a physician, rehab nurse, PT (1-2 hrs/day, 5 days/week) and OT (1-2 hrs/day, 5 days/week) to address physical and functional deficits in the context of the above medical diagnosis(es)? Yes Addressing deficits in the following areas: balance, endurance, locomotion, strength, transferring, bowel/bladder control, bathing, dressing, feeding, grooming, toileting and psychosocial support 5. Can the patient actively participate in an intensive therapy program of at least 3 hrs of therapy per day at least 5 days per week? Yes 6. The potential for patient to make measurable gains while on inpatient  rehab is good 7. Anticipated functional outcomes upon discharge from inpatient rehab are modified independent and supervision  with PT, modified independent and supervision with OT, n/a with SLP. 8. Estimated rehab length of stay to reach the above functional goals is: 10-17 days 9. Anticipated D/C setting: Home 10. Anticipated post D/C treatments: HH therapy 11. Overall Rehab/Functional Prognosis: excellent  RECOMMENDATIONS: This patient's condition is appropriate for continued rehabilitative care in the following setting: CIR Patient has agreed to participate in recommended program. Yes Note that insurance prior authorization may be required for reimbursement for recommended care.  Comment: Rehab Admissions Coordinator to follow up.  Thanks,  Ricky Oyster, MD, [place] [facility]    Mcarthur Rossetti Angiulli, PA-C 01/20/2018        Revision History                        Routing History

## 2018-01-23 NOTE — Interval H&P Note (Signed)
History and Physical Interval Note:  01/23/2018 10:08 AM  Ricky RombergWilliam Ray Turnley Jr.  has presented today for surgery, with the diagnosis of stroke  The various methods of treatment have been discussed with the patient and family. After consideration of risks, benefits and other options for treatment, the patient has consented to  Procedure(s): TRANSESOPHAGEAL ECHOCARDIOGRAM (TEE) (N/A) as a surgical intervention .  The patient's history has been reviewed, patient examined, no change in status, stable for surgery.  I have reviewed the patient's chart and labs.  Questions were answered to the patient's satisfaction.     Daesean Lazarz [employer]

## 2018-01-23 NOTE — PMR Pre-admission (Addendum)
PMR Admission Coordinator Pre-Admission Assessment  Patient: Ricky Yanko. is an 62 y.o., male MRN: [id number] DOB: 09-24-1955 Height: 6' (182.9 cm) Weight: 97.7 kg              Insurance Information HMO:     PPO:      PCP:      IPA:      80/20:      OTHER: Programmer, systems /retired PRIMARY: Dyanne Iha      Policy#: [id number]      Subscriber: pt CM Name: Otila Kluver      Phone#: [phone]     Fax#: online uploaded Pre-Cert#: [id number]      Employer: retired Nature conservation officer Benefits:  Phone #: online     Name: 01/23/2018 Eff. Date: 02/09/2016     Deduct: $150      Out of Pocket Max: $3000      Life Max: none CIR: $250 co pay per day until OOP max met      SNF: $250 per day until OOP max met Outpatient: $41 per visit     Co-Pay: visits per medical neccesity and must be authorized Home Health: 100%      Co-Pay: must be authorized DME: 80%     Co-Pay: 20% Providers: in network  SECONDARY: none       Medicaid Application Date:       Case Manager:  Disability Application Date:       Case Worker:   Emergency Facilities manager Information    Name Relation Home Work Mobile   Larimer,Clarke Son   [phone]     Current Medical History  Patient Admitting Diagnosis: post circulation infarcts  History of Present Illness: Ricky Bright is a 62 year old right-handed male with history of hypertension, hyperlipidemia and diabetes mellitus on no prescription medications.  Presented 01/19/2018 with persistent ataxia. Patient states recent fall off a truck 3 weeks ago and has had balance difficulties since that time. Denied any swallowing, speech or visual deficits. MRI showed multiple areas of acute subacute infarction including the cerebellum bilaterally right occipital lobe, right thalamus and right caudate. CT angiogram of head and neck right vertebral artery is occluded at the upper cervical region to the foramen magnum level. 80% or greater stenosis of the left V4 segment. Patient did  not receive TPA. Echocardiogram with grade 1 diastolic dysfunction. Systolic function was normal.No cardiac source of embolism was identified.  TEE  Showed ejection fraction of 60% no LAD up in the appendage thrombus. There was a small PFO. No significant tricuspid regurgitation.and loop recorder placement 01/23/2018. Currently maintained on aspirin and Plavix for CVA prophylaxis 3 weeks then aspirin alone. Subcutaneous Lovenox for DVT prophylaxis. Tolerating a regular diet.   Complete NIHSS TOTAL: 0    Past Medical History  Past Medical History:  Diagnosis Date  . Diabetes mellitus without complication ([place])   . HLD (hyperlipidemia)   . Hypercholesteremia   . Hypertension     Family History  family history includes Stroke in his father and paternal grandfather.  Prior Rehab/Hospitalizations:  Has the patient had major surgery during 100 days prior to admission? No  Current Medications   Current Facility-Administered Medications:  .  0.9 %  sodium chloride infusion, , Intravenous, Continuous, Rosalin Hawking, MD, Last Rate: 50 mL/hr at 01/23/18 0041 .  acetaminophen (TYLENOL) tablet 650 mg, 650 mg, Oral, Q4H PRN **OR** acetaminophen (TYLENOL) solution 650 mg, 650 mg, Per Tube, Q4H PRN **OR** acetaminophen (TYLENOL) suppository 650  mg, 650 mg, Rectal, Q4H PRN, Rise Patience, MD .  amLODipine (NORVASC) tablet 10 mg, 10 mg, Oral, Daily, Rosalin Hawking, MD, 10 mg at 01/23/18 1140 .  aspirin EC tablet 81 mg, 81 mg, Oral, Daily, Rise Patience, MD, 81 mg at 01/23/18 1140 .  atorvastatin (LIPITOR) tablet 80 mg, 80 mg, Oral, q1800, Rise Patience, MD, 80 mg at 01/22/18 1815 .  clopidogrel (PLAVIX) tablet 75 mg, 75 mg, Oral, Daily, Rise Patience, MD, 75 mg at 01/23/18 1140 .  enoxaparin (LOVENOX) injection 40 mg, 40 mg, Subcutaneous, Q24H, Rise Patience, MD, 40 mg at 01/22/18 2238 .  insulin aspart (novoLOG) injection 0-9 Units, 0-9 Units, Subcutaneous, TID WC,  Rise Patience, MD, 1 Units at 01/22/18 1728 .  lisinopril (PRINIVIL,ZESTRIL) tablet 10 mg, 10 mg, Oral, BID, Rosalin Hawking, MD, 10 mg at 01/23/18 1140 .  senna-docusate (Senokot-S) tablet 1 tablet, 1 tablet, Oral, QHS PRN, Rise Patience, MD  Patients Current Diet:  Diet Order            Diet NPO time specified Except for: Sips with Meds  Diet effective midnight              Precautions / Restrictions Precautions Precautions: Fall Restrictions Weight Bearing Restrictions: No   Has the patient had 2 or more falls or a fall with injury in the past year?Yes  Prior Activity Level Community (5-7x/wk): INdependent and active; driving; retired Information systems manager. Was independent and driving until 3 weeks ago. [place] while moving to his new home from the back step off the rental truck. Hit his head and his legs. Numerous falls since that time.  Home Assistive Devices / Equipment Home Assistive Devices/Equipment: None Home Equipment: None  Prior Device Use: Indicate devices/aids used by the patient prior to current illness, exacerbation or injury? None of the above  Prior Functional Level Prior Function Level of Independence: Independent Comments: Pt was fully independent prior to fall/onset of symptoms.  Since that time, he has had multiple falls and has stopped driving due to feeling insecure.  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  Independent  Indoor Mobility: Did the patient need assistance with walking from room to room (with or without device)? Independent  Stairs: Did the patient need assistance with internal or external stairs (with or without device)? Independent  Functional Cognition: Did the patient need help planning regular tasks such as shopping or remembering to take medications? Independent  Current Functional Level Cognition   Overall Cognitive Status: Within Functional Limits for tasks assessed Orientation Level: Oriented X4 General Comments: Pt noted to frequently repeat himself     Extremity Assessment (includes Sensation/Coordination)  Upper Extremity Assessment: Defer to OT evaluation RUE Deficits / Details: tremor noted which he reports is long stangind   Lower Extremity Assessment: Defer to PT evaluation RLE Coordination: decreased gross motor LLE Coordination: decreased gross motor    ADLs  Overall ADL's : Needs assistance/impaired Eating/Feeding: Independent Grooming: Wash/dry hands, Wash/dry face, Oral care, Brushing hair, Minimal assistance, Standing Upper Body Bathing: Supervision/ safety, Set up, Sitting Lower Body Bathing: Minimal assistance, Sit to/from stand Upper Body Dressing : Set up, Sitting Lower Body Dressing: Minimal assistance, Sit to/from stand Lower Body Dressing Details (indicate cue type and reason): Pt was instructed in use of sock aid, and was able to  return demonstration  Toilet Transfer: Minimal assistance, Ambulation, Comfort height toilet Toileting- Clothing Manipulation and Hygiene: Minimal assistance Functional mobility during ADLs: Min guard, Minimal assistance General ADL Comments: requires assist for balance.     Mobility  Overal bed mobility: Modified Independent Bed Mobility: Supine to Sit, Sit to Supine Supine to sit: Modified independent (Device/Increase time) Sit to supine: Modified independent (Device/Increase time) General bed mobility comments: No assistance needed to move in and out of bed.      Transfers  Overall transfer level: Needs assistance Equipment used: Rolling walker (2 wheeled), None Transfers: Sit to/from Stand Sit to Stand: Min guard Stand pivot transfers: Min guard General transfer comment: min guard for safety, cues for hand placement as patient pulls on RW to achieve standing.      Ambulation / Gait / Stairs / Wheelchair Mobility   Ambulation/Gait Ambulation/Gait assistance: Counsellor (Feet): 150 Feet Assistive device: Rolling walker (2 wheeled) Gait Pattern/deviations: Step-through pattern, Decreased stride length, Drifts right/left, Trunk flexed General Gait Details: Pt continues to present with an unsteady gait pattern and required cues for RW safety, increased posture/upper trunk control, and cues for pacing.  Pt remains heavily reliant of RW and was not using a device at baseline.   Gait velocity: Decreased     Posture / Balance Dynamic Sitting Balance Sitting balance - Comments: close guarding to min A when leaning forward to don sock  Balance Overall balance assessment: Needs assistance Sitting-balance support: Feet supported Sitting balance-Leahy Scale: Good Sitting balance - Comments: close guarding to min A when leaning forward to don sock  Standing balance support: During functional activity Standing balance-Leahy Scale: Fair Standing balance comment: Reliant on BUE support     Special needs/care consideration BiPAP/CPAP n/a CPM n/a Continuous Drip IV n/a Dialysis n/a Life Vest n/a Oxygen n/a Special Bed n/a Trach Size n/a Wound Vac n/a Skin intact Bowel mgmt: LBM 12/13 continent Bladder mgmt: continent Diabetic mgmt Hgb A1c 6.3 Loop recorder to be placed 12/16 before admit   Previous Home Environment Living Arrangements: Alone  Lives With: Alone Available Help at Discharge: Family, Available PRN/intermittently Type of Home: House Home Layout: One level Home Access: Stairs to enter Technical brewer of Steps: 2 Bathroom Shower/Tub: Chiropodist: Standard Bathroom Accessibility: Yes How Accessible: Accessible via walker Home Care Services: No Additional Comments: Pt has a cat and dog for whom he is reponsible   Discharge Living Setting Plans for Discharge Living Setting: Patient's home, Alone Type of Home at Discharge: House Discharge Home  Layout: One level Discharge Home Access: Stairs to enter Entrance Stairs-Rails: None Entrance Stairs-Number of Steps: 2 Discharge Bathroom Shower/Tub: Tub/shower unit Discharge Bathroom Toilet: Standard Discharge Bathroom Accessibility: Yes How Accessible: Accessible via walker Does the patient have any problems obtaining your medications?: No  Social/Family/Support Systems Patient Roles: Parent(has one local son who works for [employer] as a Dealer) Sport and exercise psychologist Information: son Anticipated Caregiver: son prn Anticipated Ambulance person Information: see above Ability/Limitations of Caregiver: son works Careers adviser: Intermittent Discharge Plan Discussed with Primary Caregiver: Yes Is Caregiver In Agreement with Plan?: Yes Does Caregiver/Family have Issues with Lodging/Transportation while Pt is in Rehab?: No  Goals/Additional Needs Patient/Family Goal for Rehab: Mod I to supervision with PT and OT Expected length of stay: ELOS 10 to 14 days Additional Information: Patietn's Dog and cat are boarding at his vet Pt/Family Agrees to Admission and willing to participate: Yes Program Orientation Provided & Reviewed with Pt/Caregiver Including Roles  &  Responsibilities: Yes  Decrease burden of Care through IP rehab admission: n/a  Possible need for SNF placement upon discharge: not anticipated  Patient Condition: This patient's medical and functional status has changed since the consult dated: 01/20/2018 in which the Rehabilitation Physician determined and documented that the patient's condition is appropriate for intensive rehabilitative care in an inpatient rehabilitation facility. See "History of Present Illness" (above) for medical update. Functional changes are: overall min guard. Patient's medical and functional status update has been discussed with the Rehabilitation physician and patient remains appropriate for inpatient rehabilitation. Will admit to inpatient rehab  today.  Preadmission Screen Completed By:  Cleatrice Burke, 01/23/2018 1:56 PM ______________________________________________________________________   Discussed status with Dr. Posey Pronto on 01/23/2018 at  1404 and received telephone approval for admission today.  Admission Coordinator:  Cleatrice Burke, time 7615 Date 01/23/2018

## 2018-01-23 NOTE — Progress Notes (Signed)
Inpatient Rehabilitation Admissions Coordinator  I met with patient at bedside to discuss goals and expectations of an inpt rehab admit. He is in agreement. I have begun authorization with Dyanne Iha and await their approval to possibly admit pt today.   Danne Baxter, RN, MSN Rehab Admissions Coordinator [phone] 01/23/2018 12:47 PM

## 2018-01-23 NOTE — Discharge Instructions (Signed)
Implant site care instructions °Keep incision clean and dry for 3 days. °You can remove outer dressing tomorrow. °Leave steri-strips (little pieces of tape) on until seen in the office for wound check appointment. °Call the office ([phone]) for redness, drainage, swelling, or fever. ° °

## 2018-01-23 NOTE — Progress Notes (Signed)
    [facility] has been requested to perform a transesophageal echocardiogram on 01/23/2018 for stroke.  After careful review of history and examination, the risks and benefits of transesophageal echocardiogram have been explained including risks of esophageal damage, perforation (1:10,000 risk), bleeding, pharyngeal hematoma as well as other potential complications associated with conscious sedation including aspiration, arrhythmia, respiratory failure and death. Alternatives to treatment were discussed, questions were answered. Patient is willing to proceed.  62 yo male with PMH of HTN, HLD and DM II presented with stroke. MRI showed multiple areas of subacute and acute infarcts. Vitals stable, no anemia or thrombocytopenia.   Azalee CourseHao Lateesha Bezold, PA-C 01/23/2018 10:04 AM

## 2018-01-23 NOTE — Progress Notes (Signed)
  Echocardiogram Echocardiogram Transesophageal has been performed.  Janalyn HarderWest, Brayten Komar R 01/23/2018, 10:46 AM

## 2018-01-23 NOTE — Progress Notes (Signed)
Inpatient Rehabilitation Admissions Coordinator  I have insurance approval to admit pt to inpt rehab today after LE dopplers and LOOP placement. I have notified Francis Dowseenee Ursuy, PA, Dr. Elvera LennoxGherghe, patient, RN, and RN CM. I will make the arrangements to admit today after workup complete.  Ottie GlazierBarbara Bennie Scaff, RN, MSN Rehab Admissions Coordinator [phone] 01/23/2018 1:50 PM

## 2018-01-23 NOTE — Progress Notes (Signed)
Pt tray to be routed to [address], pt discharged to [address] [facility] via wheelchair with his belongings gathered by NT [place]. Pt notified his family/friends of his new location on [facility] [address].

## 2018-01-24 ENCOUNTER — Inpatient Hospital Stay (HOSPITAL_COMMUNITY): Admitting: Physical Therapy

## 2018-01-24 ENCOUNTER — Inpatient Hospital Stay (HOSPITAL_COMMUNITY): Admitting: Occupational Therapy

## 2018-01-24 ENCOUNTER — Encounter (HOSPITAL_COMMUNITY): Payer: Self-pay | Admitting: Internal Medicine

## 2018-01-24 DIAGNOSIS — E669 Obesity, unspecified: Secondary | ICD-10-CM

## 2018-01-24 DIAGNOSIS — I10 Essential (primary) hypertension: Secondary | ICD-10-CM

## 2018-01-24 DIAGNOSIS — E1169 Type 2 diabetes mellitus with other specified complication: Secondary | ICD-10-CM

## 2018-01-24 DIAGNOSIS — I6350 Cerebral infarction due to unspecified occlusion or stenosis of unspecified cerebral artery: Secondary | ICD-10-CM

## 2018-01-24 LAB — CBC WITH DIFFERENTIAL/PLATELET
Abs Immature Granulocytes: 0.01 10*3/uL (ref 0.00–0.07)
BASOS PCT: 1 %
Basophils Absolute: 0 10*3/uL (ref 0.0–0.1)
Eosinophils Absolute: 0.2 10*3/uL (ref 0.0–0.5)
Eosinophils Relative: 2 %
HCT: 49 % (ref 39.0–52.0)
Hemoglobin: 16.4 g/dL (ref 13.0–17.0)
Immature Granulocytes: 0 %
Lymphocytes Relative: 28 %
Lymphs Abs: 1.9 10*3/uL (ref 0.7–4.0)
MCH: 29.9 pg (ref 26.0–34.0)
MCHC: 33.5 g/dL (ref 30.0–36.0)
MCV: 89.4 fL (ref 80.0–100.0)
MONOS PCT: 9 %
Monocytes Absolute: 0.6 10*3/uL (ref 0.1–1.0)
NEUTROS PCT: 60 %
Neutro Abs: 3.9 10*3/uL (ref 1.7–7.7)
Platelets: 267 10*3/uL (ref 150–400)
RBC: 5.48 MIL/uL (ref 4.22–5.81)
RDW: 11.7 % (ref 11.5–15.5)
WBC: 6.6 10*3/uL (ref 4.0–10.5)
nRBC: 0 % (ref 0.0–0.2)

## 2018-01-24 LAB — COMPREHENSIVE METABOLIC PANEL
ALT: 19 U/L (ref 0–44)
AST: 20 U/L (ref 15–41)
Albumin: 3.8 g/dL (ref 3.5–5.0)
Alkaline Phosphatase: 59 U/L (ref 38–126)
Anion gap: 11 (ref 5–15)
BUN: 6 mg/dL — ABNORMAL LOW (ref 8–23)
CHLORIDE: 102 mmol/L (ref 98–111)
CO2: 26 mmol/L (ref 22–32)
Calcium: 9.3 mg/dL (ref 8.9–10.3)
Creatinine, Ser: 0.95 mg/dL (ref 0.61–1.24)
GFR calc Af Amer: >60 mL/min (ref >60)
GFR calc non Af Amer: >60 mL/min (ref >60)
Glucose, Bld: 130 mg/dL — ABNORMAL HIGH (ref 70–99)
POTASSIUM: 3.7 mmol/L (ref 3.5–5.1)
Sodium: 139 mmol/L (ref 135–145)
Total Bilirubin: 0.9 mg/dL (ref 0.3–1.2)
Total Protein: 7.3 g/dL (ref 6.5–8.1)

## 2018-01-24 LAB — GLUCOSE, CAPILLARY
GLUCOSE-CAPILLARY: 142 mg/dL — AB (ref 70–99)
Glucose-Capillary: 130 mg/dL — ABNORMAL HIGH (ref 70–99)
Glucose-Capillary: 126 mg/dL — ABNORMAL HIGH (ref 70–99)
Glucose-Capillary: 125 mg/dL — ABNORMAL HIGH (ref 70–99)

## 2018-01-24 NOTE — Evaluation (Signed)
Occupational Therapy Assessment and Plan  Patient Details  Name: Ricky Bright. MRN: [id number] Date of Birth: 10/18/1955  OT Diagnosis: hemiplegia affecting non-dominant side, muscle weakness (generalized) and lightheadedness Rehab Potential: Rehab Potential (ACUTE ONLY): Good ELOS: 5-7 days   Today's Date: 01/24/2018 OT Individual Time: 0900-1000 OT Individual Time Calculation (min): 60 min     Problem List:  Patient Active Problem List   Diagnosis Date Noted  . Posterior circulation stroke ([place]) 01/23/2018  . Dyslipidemia   . Essential hypertension   . Constipation, slow transit   . Acute CVA (cerebrovascular accident) ([place]) 01/19/2018  . Hypertensive urgency 01/19/2018  . Type 2 diabetes mellitus with vascular disease ([place]) 01/19/2018  . Hyperlipidemia 01/19/2018  . CVA (cerebral vascular accident) ([place]) 01/19/2018    Past Medical History:  Past Medical History:  Diagnosis Date  . Diabetes mellitus without complication ([place])   . HLD (hyperlipidemia)   . Hypercholesteremia   . Hypertension    Past Surgical History:  Past Surgical History:  Procedure Laterality Date  . APPENDECTOMY    . bicep tendon rupture.    [name] LOOP RECORDER INSERTION N/A 01/23/2018   Procedure: LOOP RECORDER INSERTION;  Surgeon: Evans Lance, MD;  Location: [facility];  Service: Cardiovascular;  Laterality: N/A;  . TEE WITHOUT CARDIOVERSION N/A 01/23/2018   Procedure: TRANSESOPHAGEAL ECHOCARDIOGRAM (TEE);  Surgeon: Larey Dresser, MD;  Location: [facility];  Service: Cardiovascular;  Laterality: N/A;    Assessment & Plan Clinical Impression: Patient is a 62 y.o. year old male with history of HTN, T2DM, hyperlipidemia, medication noncompliance for the past few months; who was admitted on 01/19/2018 with reports of a fall off a truck 3 weeks prior to admission and 1 week history of problems problems when walking and dizziness.  History taken from chart review and patient.  MRI  brain reviewed, showing multifocal infarcts.  Per report, multiple acute/subacute infarcts bilateral cerebellum, right occipital lobe, right thalamic and right caudate nucleus. CTA head neck showed right vertebral artery occlusion at upper cervical region the foramen magnum level probably recent occlusion explaining embolic disease with the posterior circulation and 80% or greater stenosis left V4 segment.    Stroke felt to be embolic-etiology uncertain could be related to fall with vertebral dissection versus chronic VA stenosis/occlusion versus undiagnosed A. fib.  Dr. Rigoberto Noel recommended DAPT x3 weeks followed by aspirin alone.  2D echo showed mild LVH with trivial aortic valve regurg.  He underwent TEE revealing mild to moderate LV hypertrophy, EF 55 to 60% and small PFO by color Doppler and bubble study, dilated aortic root 4.4 cm and ascending aorta at 4.0 cm.  Loop recorder placed. BLE Dopplers were negative for DVT. Blood pressures remain labile.  He continues to be limited by balance deficits as well as feeling of lightheadedness with positional changes.  CIR recommended for follow-up therapy.    Patient transferred to CIR on 01/23/2018 .    Patient currently requires min with basic self-care skills secondary to muscle weakness, decreased cardiorespiratoy endurance, and decreased standing balance, decreased postural control and decreased balance strategies.  Prior to hospitalization, patient could complete ADLs and IADLs with independent .  Patient will benefit from skilled intervention to increase independence with basic self-care skills and increase level of independence with iADL prior to discharge home independently.  Anticipate patient will require intermittent supervision and home health vs outpatient therapy (dependent upon transportation).  OT - End of Session Activity Tolerance: Tolerates 30+ min  activity with multiple rests Endurance Deficit: Yes Endurance Deficit Description: 2/2  generalized deconditioning OT Assessment Rehab Potential (ACUTE ONLY): Good OT Patient demonstrates impairments in the following area(s): Balance;Endurance;Motor;Pain;Perception;Safety OT Basic ADL's Functional Problem(s): Grooming;Bathing;Dressing;Toileting OT Advanced ADL's Functional Problem(s): Simple Meal Preparation;Light Housekeeping OT Transfers Functional Problem(s): Toilet;Tub/Shower OT Additional Impairment(s): None OT Plan OT Intensity: Minimum of 1-2 x/day, 45 to 90 minutes OT Frequency: 5 out of 7 days OT Duration/Estimated Length of Stay: 5-7 days OT Treatment/Interventions: Balance/vestibular training;Community reintegration;Discharge planning;Disease mangement/prevention;DME/adaptive equipment instruction;Functional mobility training;Neuromuscular re-education;Pain management;Patient/family education;Psychosocial support;Self Care/advanced ADL retraining;Therapeutic Activities;Therapeutic Exercise;UE/LE Strength taining/ROM;UE/LE Coordination activities;Visual/perceptual remediation/compensation OT Self Feeding Anticipated Outcome(s): Independent OT Basic Self-Care Anticipated Outcome(s): Mod I OT Toileting Anticipated Outcome(s): Mod I OT Bathroom Transfers Anticipated Outcome(s): Mod I OT Recommendation Patient destination: Home Follow Up Recommendations: Home health OT;Outpatient OT Equipment Recommended: Tub/shower bench   Skilled Therapeutic Intervention OT eval completed with discussion of rehab process, OT purpose, POC, ELOS, and goals.  ADL assessment completed with bathing and dressing at sit > stand level.  Pt ambulated to room shower without AD and CGA.  Pt reports mild lightheadedness initially upon standing, but reports that it subsided during session.  Pt completed bathing at seated level with exception of standing to wash buttocks.  Pt returned to EOB to complete dressing.  Pt donned hospital scrubs with min assist to thread LLE and increased time when donning  nonskid socks.  Pt reports pain in lower back after seated unsupported during dressing, returned to supine to rest.  Returned to seated EOB to continue with assessment of vision, strength, and coordination.  Pt returned to supine at end of session and left with all needs in reach.  OT Evaluation Precautions/Restrictions  Precautions Precautions: Fall Restrictions Weight Bearing Restrictions: No General   Vital Signs Therapy Vitals Pulse Rate: (!) 107 BP: (!) 115/91 Patient Position (if appropriate): Standing Pain Pain Assessment Pain Scale: 0-10 Pain Score: 0-No pain Home Living/Prior Functioning Home Living Family/patient expects to be discharged to:: Private residence Living Arrangements: Alone Available Help at Discharge: Family, Available PRN/intermittently Type of Home: House Home Access: Stairs to enter Technical brewer of Steps: 2 Entrance Stairs-Rails: None Home Layout: One level Bathroom Shower/Tub: Chiropodist: Standard Bathroom Accessibility: Yes Additional Comments: Pt has a cat and dog for whom he is reponsible   Lives With: Alone IADL History Homemaking Responsibilities: Yes Meal Prep Responsibility: Primary Laundry Responsibility: Primary Cleaning Responsibility: Primary Bill Paying/Finance Responsibility: Primary Shopping Responsibility: Primary Current License: Yes Prior Function Level of Independence: Independent with basic ADLs, Independent with transfers, Independent with gait, Independent with homemaking with ambulation  Able to Take Stairs?: Yes Driving: Yes Vocation: Retired Biomedical scientist: retired Therapist, art Leisure: Hobbies-yes (Comment) Comments: has 2 pets (rescue dog & cat that stay indoors with him), kayaking ADL ADL Upper Body Bathing: Supervision/safety Where Assessed-Upper Body Bathing: Shower Lower Body Bathing: Contact guard Where Assessed-Lower Body Bathing: Shower Upper Body Dressing: Setup Where  Assessed-Upper Body Dressing: Edge of bed Lower Body Dressing: Minimal assistance Where Assessed-Lower Body Dressing: Edge of bed Toilet Transfer: Therapist, music Method: Counselling psychologist: Energy manager: Curator Method: Heritage manager: Radio broadcast assistant, Grab bars Vision Baseline Vision/History: Wears glasses Wears Glasses: Reading only Patient Visual Report: No change from baseline;Diplopia(reports occasional doublevision but mostly resolved) Vision Assessment?: Yes Eye Alignment: Within Functional Limits Ocular Range of Motion: Within Functional Limits Alignment/Gaze Preference: Within Defined Limits Tracking/Visual  Pursuits: Decreased smoothness of vertical tracking Saccades: Within functional limits Visual Fields: No apparent deficits Cognition Overall Cognitive Status: Within Functional Limits for tasks assessed Arousal/Alertness: Awake/alert Orientation Level: Person;Place;Situation Person: Oriented Place: Oriented Situation: Oriented Year: 2019 Month: December Day of Week: Correct Memory: Appears intact Immediate Memory Recall: Sock;Blue;Bed Memory Recall: Sock;Blue;Bed Memory Recall Sock: Without Cue Memory Recall Blue: Without Cue Memory Recall Bed: Without Cue Attention: Alternating Awareness: Appears intact Problem Solving: Appears intact Safety/Judgment: Appears intact Sensation Sensation Light Touch: Appears Intact Proprioception: Appears Intact Coordination Gross Motor Movements are Fluid and Coordinated: No(slightly impaired LE) Fine Motor Movements are Fluid and Coordinated: Yes Finger Nose Finger Test: mild impairment on Lt 9 Hole Peg Test: Rt: 32 seconds, Lt: 29 seconds Motor  Motor Motor: Abnormal postural alignment and control Motor - Skilled Clinical Observations: generalized deconditioning Mobility  Bed Mobility Bed Mobility: Sit to  Supine;Supine to Sit Supine to Sit: Independent Sit to Supine: Independent Transfers Sit to Stand: Independent  Trunk/Postural Assessment  Cervical Assessment Cervical Assessment: Within Functional Limits Thoracic Assessment Thoracic Assessment: Within Functional Limits Lumbar Assessment Lumbar Assessment: Within Functional Limits Postural Control Postural Control: Deficits on evaluation Righting Reactions: delayed Protective Responses: delayed  Balance Balance Balance Assessed: Yes Dynamic Standing Balance Dynamic Standing - Balance Support: During functional activity Dynamic Standing - Level of Assistance: 5: Stand by assistance Dynamic Standing - Balance Activities: (washing hands at sink) Extremity/Trunk Assessment RUE Assessment RUE Assessment: Within Functional Limits LUE Assessment LUE Assessment: Within Functional Limits     Refer to Care Plan for Long Term Goals  Recommendations for other services: None    Discharge Criteria: Patient will be discharged from OT if patient refuses treatment 3 consecutive times without medical reason, if treatment goals not met, if there is a change in medical status, if patient makes no progress towards goals or if patient is discharged from hospital.  The above assessment, treatment plan, treatment alternatives and goals were discussed and mutually agreed upon: by patient  Simonne Come 01/24/2018, 9:58 AM

## 2018-01-24 NOTE — Plan of Care (Signed)
  Problem: RH BLADDER ELIMINATION Goal: RH STG MANAGE BLADDER WITH ASSISTANCE Description STG Manage Bladder With Min Assistance  Outcome: Progressing   Problem: RH SKIN INTEGRITY Goal: RH STG SKIN FREE OF INFECTION/BREAKDOWN Outcome: Progressing Goal: RH STG MAINTAIN SKIN INTEGRITY WITH ASSISTANCE Description STG Maintain Skin Integrity With Min  Assistance.  Outcome: Progressing Goal: RH STG ABLE TO PERFORM INCISION/WOUND CARE W/ASSISTANCE Description STG Able To Perform Incision/Wound Care With [employer]Min  Assistance.  Outcome: Progressing   Problem: RH SAFETY Goal: RH STG ADHERE TO SAFETY PRECAUTIONS W/ASSISTANCE/DEVICE Description STG Adhere to Safety Precautions With  Min Assistance/Device.  Outcome: Progressing Goal: RH STG DECREASED RISK OF FALL WITH ASSISTANCE Description STG Decreased Risk of Fall With [employer]Min Assistance.  Outcome: Progressing   Problem: RH COGNITION-NURSING Goal: RH STG USES MEMORY AIDS/STRATEGIES W/ASSIST TO PROBLEM SOLVE Description STG Uses Memory Aids/Strategies With  Min Assistance to Problem Solve.  Outcome: Progressing Goal: RH STG ANTICIPATES NEEDS/CALLS FOR ASSIST W/ASSIST/CUES Description STG Anticipates Needs/Calls for Assist With  Min Assistance/Cues.  Outcome: Progressing   Problem: RH PAIN MANAGEMENT Goal: RH STG PAIN MANAGED AT OR BELOW PT'S PAIN GOAL Description Pain <=2/10  Outcome: Progressing   Problem: RH KNOWLEDGE DEFICIT Goal: RH STG INCREASE KNOWLEDGE OF DIABETES Outcome: Progressing Goal: RH STG INCREASE KNOWLEDGE OF HYPERTENSION Outcome: Progressing Goal: RH STG INCREASE KNOWLEGDE OF HYPERLIPIDEMIA Outcome: Progressing Goal: RH STG INCREASE KNOWLEDGE OF STROKE PROPHYLAXIS Outcome: Progressing   Problem: RH BOWEL ELIMINATION Goal: RH STG MANAGE BOWEL WITH ASSISTANCE Description STG Manage Bowel with Min  Assistance.  Outcome: Not Progressing

## 2018-01-24 NOTE — Plan of Care (Addendum)
Nutrition Education Note   RD consulted for nutrition education regarding diabetes.   Lab Results  Component Value Date   HGBA1C 6.3 (H) 01/20/2018    DI provided "Consistent Carbohydrate Handout" handout from the Academy of Nutrition and Dietetics. Discussed different food groups and their effects on blood sugar, emphasizing carbohydrate-containing foods. Provided list of carbohydrates and recommended serving sizes of common foods.  Discussed importance of controlled and consistent carbohydrate intake throughout the day. Explained the importance of carbohydrates in the diet to maintain brain function and to consume around 3-5 servings of 15 gram carbohydrates each meal. Provided examples of ways to balance meals/snacks and encouraged intake of high-fiber, whole grain complex carbohydrates. Teach back method used.  Pt reported carb counting at home and following a diabetic diet. Pt reported eating about 2 servings of carbohydrates at each meal. 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 Pt was well aware of HA1C at 6.3 and states managing his diet at home himself. Pt reports not receiving metformin from [facility] to help manage diabetes.   Expect good compliance.  Body mass index is 29.66 kg/m. Pt meets criteria for overweight based on current BMI.  Current diet order is carb modified , patient is consuming approximately 25% of meals at this time, however, pt reports eating great and having an appetite. Labs and medications reviewed. No further nutrition interventions warranted at this time. RD contact information provided. If additional nutrition issues arise, please re-consult RD.  Bethena MidgetHannah Glyndon Tursi, MS, Dietetic Intern Pager: [phone] After hours Pager: [phone]

## 2018-01-24 NOTE — Progress Notes (Signed)
[place] PHYSICAL MEDICINE & REHABILITATION PROGRESS NOTE   Subjective/Complaints: Pt up at eob. frusrated by diarrhea overnight. Received senna last night which seemed to trigger. Hadn't had bm prior for a few days at least. Still has maintained appetite and finishing breakfast when I came in  ROS: Patient denies fever, rash, sore throat, blurred vision, nausea, vomiting,  cough, shortness of breath or chest pain, joint or back pain, headache, or mood change.    Objective:   Vas [place] Lower Extremity Venous (dvt)  Result Date: 01/23/2018  Lower Venous Study Indications: Stroke.  Performing Technologist: Blanch Media RVS  Examination Guidelines: A complete evaluation includes B-mode imaging, spectral Doppler, color Doppler, and power Doppler as needed of all accessible portions of each vessel. Bilateral testing is considered an integral part of a complete examination. Limited examinations for reoccurring indications may be performed as noted.  Right Venous Findings: +---------+---------------+---------+-----------+----------+-------+          CompressibilityPhasicitySpontaneityPropertiesSummary +---------+---------------+---------+-----------+----------+-------+ CFV      Full           Yes      Yes                          +---------+---------------+---------+-----------+----------+-------+ SFJ      Full                                                 +---------+---------------+---------+-----------+----------+-------+ FV Prox  Full                                                 +---------+---------------+---------+-----------+----------+-------+ FV Mid   Full                                                 +---------+---------------+---------+-----------+----------+-------+ FV DistalFull                                                 +---------+---------------+---------+-----------+----------+-------+ PFV      Full                                                  +---------+---------------+---------+-----------+----------+-------+ POP      Full           Yes      Yes                          +---------+---------------+---------+-----------+----------+-------+ PTV      Full                                                 +---------+---------------+---------+-----------+----------+-------+ PERO     Full                                                 +---------+---------------+---------+-----------+----------+-------+  Left Venous Findings: +---------+---------------+---------+-----------+----------+-------+          CompressibilityPhasicitySpontaneityPropertiesSummary +---------+---------------+---------+-----------+----------+-------+ CFV      Full           Yes      Yes                          +---------+---------------+---------+-----------+----------+-------+ SFJ      Full                                                 +---------+---------------+---------+-----------+----------+-------+ FV Prox  Full                                                 +---------+---------------+---------+-----------+----------+-------+ FV Mid   Full                                                 +---------+---------------+---------+-----------+----------+-------+ FV DistalFull                                                 +---------+---------------+---------+-----------+----------+-------+ PFV      Full                                                 +---------+---------------+---------+-----------+----------+-------+ POP      Full           Yes      Yes                          +---------+---------------+---------+-----------+----------+-------+ PTV      Full                                                 +---------+---------------+---------+-----------+----------+-------+ PERO     Full                                                  +---------+---------------+---------+-----------+----------+-------+    Summary: Right: There is no evidence of deep vein thrombosis in the lower extremity. No cystic structure found in the popliteal fossa. Left: There is no evidence of deep vein thrombosis in the lower extremity. No cystic structure found in the popliteal fossa.  *See table(s) above for measurements and observations. Electronically signed by Sherald Hess MD on 01/23/2018 at 6:00:41 PM.    Final    Recent Labs    01/24/18 0508  WBC 6.6  HGB 16.4  HCT 49.0  PLT 267   Recent Labs    01/24/18 0508  NA 139  K 3.7  CL 102  CO2 26  GLUCOSE 130*  BUN 6*  CREATININE 0.95  CALCIUM 9.3    Intake/Output Summary (Last 24 hours) at 01/24/2018 1108 Last data filed at 01/24/2018 0900 Gross per 24 hour  Intake 120 ml  Output 800 ml  Net -680 ml     Physical Exam: Vital Signs Blood pressure (!) 115/91, pulse (!) 107, temperature (!) 97.5 F (36.4 C), temperature source Oral, resp. rate 18, height 6' (1.829 m), weight 99.2 kg, SpO2 98 %. Constitutional: No distress . Vital signs reviewed. obese HEENT: EOMI, oral membranes moist Neck: supple Cardiovascular: RRR without murmur. No JVD    Respiratory: CTA Bilaterally without wheezes or rales. Normal effort    GI: BS +, non-tender, non-distended  Musculoskeletal:     Comments: No edema or tenderness in extremities  Neurological: He is alert and oriented to person, place, and time. Displays reasonable insight and attention Motor: 5/5 in all 4's Sensation intact light touch in all 4 limbs.  Mild left upper and lower limb ataxia.  Skin: Skin is warm and dry.  Psychiatric: flat but generally appropriate.     Assessment/Plan: 1. Functional deficits secondary to bilateral, multifocal posterior circulation infarcts which require 3+ hours per day of interdisciplinary therapy in a comprehensive inpatient rehab setting.  Physiatrist is providing close team supervision and  24 hour management of active medical problems listed below.  Physiatrist and rehab team continue to assess barriers to discharge/monitor patient progress toward functional and medical goals  Care Tool:  Bathing              Bathing assist       Upper Body Dressing/Undressing Upper body dressing   What is the patient wearing?: Hospital gown only    Upper body assist Assist Level: Independent    Lower Body Dressing/Undressing Lower body dressing            Lower body assist       Toileting Toileting    Toileting assist Assist for toileting: Minimal Assistance - Patient > 75%     Transfers Chair/bed transfer  Transfers assist     Chair/bed transfer assist level: Minimal Assistance - Patient > 75%     Locomotion Ambulation   Ambulation assist      Assist level: Minimal Assistance - Patient > 75% Assistive device: (none) Max distance: 150 ft    Walk 10 feet activity   Assist     Assist level: Minimal Assistance - Patient > 75%     Walk 50 feet activity   Assist    Assist level: Minimal Assistance - Patient > 75%      Walk 150 feet activity   Assist    Assist level: Minimal Assistance - Patient > 75%      Walk 10 feet on uneven surface  activity   Assist     Assist level: Minimal Assistance - Patient > 75%     Wheelchair     Assist Will patient use wheelchair at discharge?: No   Wheelchair activity did not occur: N/A         Wheelchair 50 feet with 2 turns activity    Assist    Wheelchair 50 feet with 2 turns activity did not occur: N/A       Wheelchair 150 feet activity     Assist Wheelchair 150 feet activity did not occur: N/A        Medical Problem List and Plan: 1.  Balance deficits as well as feeling of lightheadedness with positional changes secondary to bilateral, multi-focal posterior circulation infarcts (R>L)  -beginning therapies today 2.  DVT Prophylaxis/Anticoagulation:  Pharmaceutical: Lovenox 3. Pain Management: Tylenol as needed 4. Mood: Use LCSW to follow for evaluation and support 5. Neuropsych: This patient is capable of making decisions on his own behalf. 6. Skin/Wound Care: Routine pressure leave mesh 7. Fluids/Electrolytes/Nutrition: appetite appears excellent so far.   -I personally reviewed the patient's labs today.   8.  Hypertension:    Continue amlodipine and lisinopril.  -DBP borderline today---observe 9. T2DM: Hemoglobin A1c-6.3.    sliding scale insulin for tighter control.   -poor control so far  -appears to have not been on medication at home  -may need to start an oral  -observe today 10.  Dyslipidemia: Now on Lipitor 11. Constipation: multiple loose stools  -hold senna-s    LOS: 1 days A FACE TO FACE EVALUATION WAS PERFORMED  Ricky Bright 01/24/2018, 11:08 AM

## 2018-01-24 NOTE — Evaluation (Addendum)
Physical Therapy Assessment and Plan  Patient Details  Name: Ricky Bright. MRN: [id number] Date of Birth: Feb 08, 1956  PT Diagnosis: Abnormality of gait, Coordination disorder, Difficulty walking and Hemiparesis non-dominant Rehab Potential: Excellent ELOS: 5-7 days   Today's Date: 01/24/2018 PT Individual Time: 0802-0901 PT Individual Time Calculation (min): 59 min    Problem List:  Patient Active Problem List   Diagnosis Date Noted  . Posterior circulation stroke ([place]) 01/23/2018  . Dyslipidemia   . Essential hypertension   . Constipation, slow transit   . Acute CVA (cerebrovascular accident) ([place]) 01/19/2018  . Hypertensive urgency 01/19/2018  . Type 2 diabetes mellitus with vascular disease ([place]) 01/19/2018  . Hyperlipidemia 01/19/2018  . CVA (cerebral vascular accident) ([place]) 01/19/2018    Past Medical History:  Past Medical History:  Diagnosis Date  . Diabetes mellitus without complication ([place])   . HLD (hyperlipidemia)   . Hypercholesteremia   . Hypertension    Past Surgical History:  Past Surgical History:  Procedure Laterality Date  . APPENDECTOMY    . bicep tendon rupture.    [name] LOOP RECORDER INSERTION N/A 01/23/2018   Procedure: LOOP RECORDER INSERTION;  Surgeon: Evans Lance, MD;  Location: [facility];  Service: Cardiovascular;  Laterality: N/A;  . TEE WITHOUT CARDIOVERSION N/A 01/23/2018   Procedure: TRANSESOPHAGEAL ECHOCARDIOGRAM (TEE);  Surgeon: Larey Dresser, MD;  Location: [facility] ENDOSCOPY;  Service: Cardiovascular;  Laterality: N/A;    Assessment & Plan Clinical Impression: Patient is a 62 y.o. year old male with history of HTN, T2DM, hyperlipidemia, medication noncompliance for the past few months; who was admitted on 01/19/2018 with reports of a fall off a truck 3 weeks prior to admission and 1 week history of problems problems when walking and dizziness.  History taken from chart review and patient.  MRI brain reviewed, showing  multifocal infarcts.  Per report, multiple acute/subacute infarcts bilateral cerebellum, right occipital lobe, right thalamic and right caudate nucleus. CTA head neck showed right vertebral artery occlusion at upper cervical region the foramen magnum level probably recent occlusion explaining embolic disease with the posterior circulation and 80% or greater stenosis left V4 segment.    Stroke felt to be embolic-etiology uncertain could be related to fall with vertebral dissection versus chronic VA stenosis/occlusion versus undiagnosed A. fib.  Dr. Rigoberto Noel recommended DAPT x3 weeks followed by aspirin alone.  2D echo showed mild LVH with trivial aortic valve regurg.  He underwent TEE revealing mild to moderate LV hypertrophy, EF 55 to 60% and small PFO by color Doppler and bubble study, dilated aortic root 4.4 cm and ascending aorta at 4.0 cm.  Loop recorder placed. BLE Dopplers were negative for DVT. Blood pressures remain labile.  He continues to be limited by balance deficits as well as feeling of lightheadedness with positional changes.  CIR recommended for follow-up therapy.  Patient transferred to CIR on 01/23/2018 .   Patient currently requires min with mobility secondary to decreased cardiorespiratoy endurance, decreased coordination, and decreased standing balance, decreased postural control, hemiplegia and decreased balance strategies.  Prior to hospitalization, patient was independent  with mobility and lived with Alone in a House home.  Home access is 2Stairs to enter.  Patient will benefit from skilled PT intervention to maximize safe functional mobility and minimize fall risk for planned discharge home alone.  Anticipate patient will benefit from follow up [place] at discharge.  PT - End of Session Activity Tolerance: Tolerates 30+ min activity with multiple rests Endurance  Deficit: Yes Endurance Deficit Description: 2/2 generalized deconditioning PT Assessment Rehab Potential (ACUTE/IP ONLY):  Excellent PT Barriers to Discharge: Decreased caregiver support(pt lives alone) PT Patient demonstrates impairments in the following area(s): Balance;Behavior;Endurance;Motor;Safety PT Transfers Functional Problem(s): Bed to Chair;Car;Furniture;Floor PT Locomotion Functional Problem(s): Ambulation;Stairs PT Plan PT Intensity: Minimum of 1-2 x/day ,45 to 90 minutes PT Frequency: 5 out of 7 days PT Duration Estimated Length of Stay: 5-7 days PT Treatment/Interventions: Ambulation/gait training;Community reintegration;DME/adaptive equipment instruction;Neuromuscular re-education;Psychosocial support;Stair training;UE/LE Strength taining/ROM;UE/LE Coordination activities;Therapeutic Activities;Discharge planning;Balance/vestibular training;Disease management/prevention;Patient/family education;Splinting/orthotics;Therapeutic Exercise;Visual/perceptual remediation/compensation;Functional mobility training PT Transfers Anticipated Outcome(s): mod I with LRAD PT Locomotion Anticipated Outcome(s): mod I with LRAD PT Recommendation Recommendations for Other Services: Therapeutic Recreation consult Therapeutic Recreation Interventions: Outing/community reintergration;Pet therapy;Kitchen group Follow Up Recommendations: Home health PT Patient destination: Home Equipment Recommended: To be determined  Skilled Therapeutic Intervention Patient received on EOB with RN in room but exiting shortly. Pt reports need to use restroom and ambulates within room & bathroom without AD & min assist. Pt completes toilet transfer with CGA, +BM and performs peri hygiene without assistance. Pt ambulates out of bathroom without assistance with therapist educating him on safety plan/need for staff assistance during stay in CIR. Pt ambulates around unit without AD & min assist, requiring seated rest break 2/2 lightheadedness that does not dissipate with rest but vitals WNL (see below). Pt negotiates ramp & uneven surface without  AD & min assist, completes bed mobility in apartment independently, and negotiates 4 steps without B rails and mod assist 2/2 decreased balance & strength with therapist providing max education for compensatory pattern. Pt uses bathroom with +BM a second time during the session. Pt completes car transfer at small SUV simulated height with min assist to ambulate to car. Educated pt on ELOS, daily therapy schedule, weekly interdisciplinary team meetings, and other various CIR information. At end of session pt left sitting on EOB with alarm set, awaiting OT arrival.   Addendum: No c/o pain reported.   PT Evaluation Precautions/Restrictions Precautions Precautions: Fall Restrictions Weight Bearing Restrictions: No  General Chart Reviewed: Yes Additional Pertinent History: HTN, HLD, DM Response to Previous Treatment: Not applicable Family/Caregiver Present: No   Vital Signs Pulse Rate: 98 BP: 130/84 Patient Position (if appropriate): Sitting  Standing: HR = 107 bpm BP = 115/91 mmHg (LUE)   Home Living/Prior Functioning Home Living Available Help at Discharge: Family;Available PRN/intermittently Type of Home: House Home Access: Stairs to enter [employer] of Steps: 2 Entrance Stairs-Rails: None Home Layout: One level  Lives With: Alone Prior Function Level of Independence: Independent with basic ADLs;Independent with transfers;Independent with gait;Independent with homemaking with ambulation  Able to Take Stairs?: Yes Driving: Yes Vocation Requirements: retired Therapist, art Leisure: Hobbies-yes (Comment) Comments: has 2 pets (rescue dog & cat that stay indoors with him), kayaking  Vision/Perception  Pt reports wearing reading glasses at baseline. Pt reports intermittent diplopia & blurry vision following medical event. Perception WNL.  Cognition Overall Cognitive Status: Within Functional Limits for tasks assessed Arousal/Alertness: Awake/alert Orientation Level:  Oriented X4 Memory: Appears intact Awareness: Appears intact Problem Solving: Appears intact  Sensation Sensation Light Touch: Appears Intact(BLE) Proprioception: Appears Intact(BLE) Coordination Gross Motor Movements are Fluid and Coordinated: (slightly impaired LE) Fine Motor Movements are Fluid and Coordinated: Yes  Motor  Motor Motor: Abnormal postural alignment and control Motor - Skilled Clinical Observations: generalized deconditioning   Mobility Bed Mobility Bed Mobility: Sit to Supine;Supine to Sit Supine to Sit: Independent Sit to Supine: Independent Transfers Transfers:  Sit to Stand Sit to Stand: Independent   Locomotion  Gait Ambulation: Yes Gait Assistance: Minimal Assistance - Patient > 75% Gait Distance (Feet): 150 Feet Assistive device: None Gait Gait: Yes Gait Pattern: (decreased weight shift L, decreased step & stride length, decreased gait speed) Stairs / Additional Locomotion Stairs: Yes Stairs Assistance: Moderate Assistance - Patient 50 - 74% Stair Management Technique: No rails Number of Stairs: 4 Height of Stairs: (6 inches) Ramp: Minimal Assistance - Patient >75%(ambulatory without AD) Wheelchair Mobility Wheelchair Mobility: No   Trunk/Postural Assessment  Cervical Assessment Cervical Assessment: Within Functional Limits Thoracic Assessment Thoracic Assessment: Within Functional Limits Lumbar Assessment Lumbar Assessment: Within Functional Limits Postural Control Postural Control: Deficits on evaluation Righting Reactions: delayed Protective Responses: delayed   Balance Balance Balance Assessed: Yes Dynamic Standing Balance Dynamic Standing - Balance Support: During functional activity Dynamic Standing - Level of Assistance: 5: Stand by assistance Dynamic Standing - Balance Activities: (washing hands at sink)  Extremity Assessment  RUE Assessment RUE Assessment: Within Functional Limits LUE Assessment LUE Assessment:  Within Functional Limits RLE Assessment RLE Assessment: Within Functional Limits General Strength Comments: 5/5 LLE Assessment LLE Assessment: Within Functional Limits General Strength Comments: 5/5    Refer to Care Plan for Long Term Goals  Recommendations for other services: Therapeutic Recreation  Pet therapy, Kitchen group and Outing/community reintegration  Discharge Criteria: Patient will be discharged from PT if patient refuses treatment 3 consecutive times without medical reason, if treatment goals not met, if there is a change in medical status, if patient makes no progress towards goals or if patient is discharged from hospital.  The above assessment, treatment plan, treatment alternatives and goals were discussed and mutually agreed upon: by patient  Waunita Schooner 01/24/2018, 11:36 AM

## 2018-01-24 NOTE — Progress Notes (Signed)
Occupational Therapy Session Note  Patient Details  Name: Ricky RombergWilliam Ray Lemay Jr. MRN: [id number] Date of Birth: Dec 15, 1955  Today's Date: 01/24/2018 OT Individual Time: 1448-1530 OT Individual Time Calculation (min): 42 min    Short Term Goals: Week 1:  OT Short Term Goal 1 (Week 1): STG = LTGs due to short ELOS  Skilled Therapeutic Interventions/Progress Updates:    Treatment session with focus on functional transfers in home environment and dynamic standing balance.  Pt ambulated to ADL apt with CGA without AD.  Engaged in tub/shower transfers with tub transfer bench, shower seat, and 3 in 1 with CGA to supervision depending on type of DME.  Discussed insurance only covering 3 in 1.  Completed toilet transfer from standard toilet, pt pushing up from thighs with close supervision.  Transferred in/out of bed utilizing technique pt would use at home with CGA.  Engaged in dynamic standing balance in therapy gym progressing from standing on floor to standing on foam wedge for increased balance challenge while incorporating trunk rotation and reaching outside BOS with CGA to close supervision.  Pt ambulated back to room while balancing ball on tennis racket for increased challenge, CGA for balance and pt without dropping ball.  Pt left supine in bed with all needs in reach.  Therapy Documentation Precautions:  Precautions Precautions: Fall Restrictions Weight Bearing Restrictions: No General:   Vital Signs: Therapy Vitals Temp: 97.7 F (36.5 C) Pulse Rate: 77 Resp: 19 BP: 129/79 Patient Position (if appropriate): Lying Oxygen Therapy SpO2: 98 % O2 Device: Room Air Pain: Pain Assessment Pain Scale: 0-10 Pain Score: 0-No pain   Therapy/Group: Individual Therapy  Rosalio LoudHOXIE, Aysha Livecchi 01/24/2018, 4:17 PM

## 2018-01-24 NOTE — Progress Notes (Signed)
Inpatient Rehabilitation  Patient information reviewed and entered into eRehab system by Rohnan Bartleson M. Daishia Fetterly, M.A., CCC/SLP, PPS Coordinator.  Information including medical coding, functional ability and quality indicators will be reviewed and updated through discharge.    

## 2018-01-24 NOTE — Progress Notes (Signed)
Physical Therapy Session Note  Patient Details  Name: Ricky Bright. MRN: [id number] Date of Birth: [birth year]  Today's Date: 01/24/2018 PT Individual Time: 1330-1400 PT Individual Time Calculation (min): 30 min   Short Term Goals: Week 1:  PT Short Term Goal 1 (Week 1): STG = LTG due to short ELOS.  Skilled Therapeutic Interventions/Progress Updates:    Patient received in bed, pleasant and motivated to work with therapy today. Able to complete bed mobility independently, and required min guard for functional transfers and gait multiple distances of 191f with no device during session. Completed Berg balance assessment as well as TUG test, and continued otherwise challenging balance via tandem stance and SLS based activities this session. Berg 45/56, TUG completed in 10 seconds. He was able to ambulate back to his room with no device and min guard, and was left up in the recliner with seatbelt alarm active, all needs otherwise met.   Therapy Documentation Precautions:  Precautions Precautions: Fall Restrictions Weight Bearing Restrictions: No General:   Pain: Pain Assessment Pain Scale: 0-10 Pain Score: 0-No pain     Balance: Standardized Balance Assessment Standardized Balance Assessment: Berg Balance Test;Timed Up and Go Test Berg Balance Test Sit to Stand: Able to stand without using hands and stabilize independently Standing Unsupported: Able to stand safely 2 minutes Sitting with Back Unsupported but Feet Supported on Floor or Stool: Able to sit safely and securely 2 minutes Stand to Sit: Sits safely with minimal use of hands Transfers: Able to transfer safely, minor use of hands Standing Unsupported with Eyes Closed: Able to stand 10 seconds with supervision Standing Ubsupported with Feet Together: Able to place feet together independently and stand for 1 minute with supervision From Standing, Reach Forward with Outstretched Arm: Can reach forward >12 cm safely  (5") From Standing Position, Pick up Object from Floor: Able to pick up shoe, needs supervision From Standing Position, Turn to Look Behind Over each Shoulder: Turn sideways only but maintains balance(due to pain in back ) Turn 360 Degrees: Able to turn 360 degrees safely but slowly Standing Unsupported, Alternately Place Feet on Step/Stool: Able to stand independently and complete 8 steps >20 seconds Standing Unsupported, One Foot in Front: Able to plae foot ahead of the other independently and hold 30 seconds Standing on One Leg: Able to lift leg independently and hold 5-10 seconds Total Score: 45 Timed Up and Go Test TUG: Normal TUG Normal TUG (seconds): 10 Exercises:   Other Treatments:      Therapy/Group: Individual Therapy  KDeniece ReePT, DPT, CBIS  Supplemental Physical Therapist [facility]   Pager [phone] Rehab Office [phone]  01/24/2018, 3:42 PM

## 2018-01-25 ENCOUNTER — Inpatient Hospital Stay (HOSPITAL_COMMUNITY): Admitting: Occupational Therapy

## 2018-01-25 ENCOUNTER — Inpatient Hospital Stay (HOSPITAL_COMMUNITY): Admitting: Physical Therapy

## 2018-01-25 DIAGNOSIS — R269 Unspecified abnormalities of gait and mobility: Secondary | ICD-10-CM

## 2018-01-25 DIAGNOSIS — I69398 Other sequelae of cerebral infarction: Secondary | ICD-10-CM

## 2018-01-25 LAB — GLUCOSE, CAPILLARY
Glucose-Capillary: 135 mg/dL — ABNORMAL HIGH (ref 70–99)
Glucose-Capillary: 112 mg/dL — ABNORMAL HIGH (ref 70–99)
Glucose-Capillary: 109 mg/dL — ABNORMAL HIGH (ref 70–99)
Glucose-Capillary: 176 mg/dL — ABNORMAL HIGH (ref 70–99)

## 2018-01-25 NOTE — Progress Notes (Signed)
[facility] PHYSICAL MEDICINE & REHABILITATION PROGRESS NOTE   Subjective/Complaints:  Ambulated with therapist in hallway , no AD, supervision level No diarrhea, pt was a runner and did calisthenics at home PTA  ROS: Patient without CP, SOB , N/V/D  Objective:   Vas [place] Lower Extremity Venous (dvt)  Result Date: 01/23/2018  Lower Venous Study Indications: Stroke.  Performing Technologist: Abram Sander RVS  Examination Guidelines: A complete evaluation includes B-mode imaging, spectral Doppler, color Doppler, and power Doppler as needed of all accessible portions of each vessel. Bilateral testing is considered an integral part of a complete examination. Limited examinations for reoccurring indications may be performed as noted.  Right Venous Findings: +---------+---------------+---------+-----------+----------+-------+          CompressibilityPhasicitySpontaneityPropertiesSummary +---------+---------------+---------+-----------+----------+-------+ CFV      Full           Yes      Yes                          +---------+---------------+---------+-----------+----------+-------+ SFJ      Full                                                 +---------+---------------+---------+-----------+----------+-------+ FV Prox  Full                                                 +---------+---------------+---------+-----------+----------+-------+ FV Mid   Full                                                 +---------+---------------+---------+-----------+----------+-------+ FV DistalFull                                                 +---------+---------------+---------+-----------+----------+-------+ PFV      Full                                                 +---------+---------------+---------+-----------+----------+-------+ POP      Full           Yes      Yes                          +---------+---------------+---------+-----------+----------+-------+  PTV      Full                                                 +---------+---------------+---------+-----------+----------+-------+ PERO     Full                                                 +---------+---------------+---------+-----------+----------+-------+  Left Venous Findings: +---------+---------------+---------+-----------+----------+-------+          CompressibilityPhasicitySpontaneityPropertiesSummary +---------+---------------+---------+-----------+----------+-------+ CFV      Full           Yes      Yes                          +---------+---------------+---------+-----------+----------+-------+ SFJ      Full                                                 +---------+---------------+---------+-----------+----------+-------+ FV Prox  Full                                                 +---------+---------------+---------+-----------+----------+-------+ FV Mid   Full                                                 +---------+---------------+---------+-----------+----------+-------+ FV DistalFull                                                 +---------+---------------+---------+-----------+----------+-------+ PFV      Full                                                 +---------+---------------+---------+-----------+----------+-------+ POP      Full           Yes      Yes                          +---------+---------------+---------+-----------+----------+-------+ PTV      Full                                                 +---------+---------------+---------+-----------+----------+-------+ PERO     Full                                                 +---------+---------------+---------+-----------+----------+-------+    Summary: Right: There is no evidence of deep vein thrombosis in the lower extremity. No cystic structure found in the popliteal fossa. Left: There is no evidence of deep vein thrombosis in the lower  extremity. No cystic structure found in the popliteal fossa.  *See table(s) above for measurements and observations. Electronically signed by Monica Martinez MD on 01/23/2018 at 6:00:41 PM.    Final    Recent Labs    01/24/18 0508  WBC 6.6  HGB 16.4  HCT 49.0  PLT 267   Recent Labs    01/24/18 0508  NA 139  K 3.7  CL 102  CO2 26  GLUCOSE 130*  BUN 6*  CREATININE 0.95  CALCIUM 9.3    Intake/Output Summary (Last 24 hours) at 01/25/2018 0926 Last data filed at 01/24/2018 1700 Gross per 24 hour  Intake 360 ml  Output -  Net 360 ml     Physical Exam: Vital Signs Blood pressure 124/66, pulse (!) 56, temperature 98.4 F (36.9 C), resp. rate 17, height 6' (1.829 m), weight 99.2 kg, SpO2 98 %. Constitutional: No distress . Vital signs reviewed. obese HEENT: EOMI, oral membranes moist Neck: supple Cardiovascular: RRR without murmur. No JVD    Respiratory: CTA Bilaterally without wheezes or rales. Normal effort    GI: BS +, non-tender, non-distended  Musculoskeletal:     Comments: No edema or tenderness in extremities  Neurological: He is alert and oriented to person, place, and time. Displays reasonable insight and attention Motor: 5/5 in all 4's Sensation intact light touch in all 4 limbs.  Mild left upper and lower limb ataxia.  Skin: Skin is warm and dry.  Psychiatric: flat but generally appropriate.     Assessment/Plan: 1. Functional deficits secondary to bilateral, multifocal posterior circulation infarcts which require 3+ hours per day of interdisciplinary therapy in a comprehensive inpatient rehab setting.  Physiatrist is providing close team supervision and 24 hour management of active medical problems listed below.  Physiatrist and rehab team continue to assess barriers to discharge/monitor patient progress toward functional and medical goals  Care Tool:  Bathing    Body parts bathed by patient: Right arm, Left arm, Chest, Abdomen, Front perineal area,  Buttocks, Right upper leg, Left upper leg, Face         Bathing assist Assist Level: Contact Guard/Touching assist     Upper Body Dressing/Undressing Upper body dressing   What is the patient wearing?: Hospital gown only    Upper body assist Assist Level: Minimal Assistance - Patient > 75%    Lower Body Dressing/Undressing Lower body dressing      What is the patient wearing?: Incontinence brief     Lower body assist Assist for lower body dressing: Minimal Assistance - Patient > 75%     Toileting Toileting    Toileting assist Assist for toileting: Minimal Assistance - Patient > 75%     Transfers Chair/bed transfer  Transfers assist     Chair/bed transfer assist level: Contact Guard/Touching assist     Locomotion Ambulation   Ambulation assist      Assist level: Supervision/Verbal cueing Assistive device: (none) Max distance: 150 ft    Walk 10 feet activity   Assist     Assist level: Supervision/Verbal cueing Assistive device: Other (comment)(none )   Walk 50 feet activity   Assist    Assist level: Supervision/Verbal cueing Assistive device: Other (comment)(none )    Walk 150 feet activity   Assist    Assist level: Supervision/Verbal cueing      Walk 10 feet on uneven surface  activity   Assist     Assist level: Supervision/Verbal cueing     Wheelchair     Assist Will patient use wheelchair at discharge?: No   Wheelchair activity did not occur: N/A         Wheelchair 50 feet with 2 turns activity    Assist    Wheelchair 50 feet with 2 turns activity did not occur: N/A       Wheelchair 150 feet activity     Assist Wheelchair 150  feet activity did not occur: N/A        Medical Problem List and Plan: 1. Balance deficits as well as feeling of lightheadedness with positional changes secondary to bilateral, multi-focal posterior circulation infarcts (R>L)  -Team conference today please see physician  documentation under team conference tab, met with team face-to-face to discuss problems,progress, and goals. Formulized individual treatment plan based on medical history, underlying problem and comorbidities. 2.  DVT Prophylaxis/Anticoagulation: Pharmaceutical: Lovenox 3. Pain Management: Tylenol as needed 4. Mood: Use LCSW to follow for evaluation and support 5. Neuropsych: This patient is capable of making decisions on his own behalf. 6. Skin/Wound Care: Routine pressure leave mesh 7. Fluids/Electrolytes/Nutrition: appetite appears excellent so far.   -I personally reviewed the patient's labs today.   8.  Hypertension:    Continue amlodipine and lisinopril.   Vitals:   01/24/18 2014 01/25/18 0540  BP: 115/70 124/66  Pulse: 67 (!) 56  Resp: 18 17  Temp:  98.4 F (36.9 C)  SpO2: 98% 98%  controlled 12/18 9. T2DM: Hemoglobin A1c-6.3.    sliding scale insulin for tighter control.    CBG (last 3)  Recent Labs    01/24/18 1642 01/24/18 2127 01/25/18 0647  GLUCAP 126* 125* 135*  improved 12/18 10.  Dyslipidemia: Now on Lipitor 11. Constipation: multiple loose stools  -hold senna-s    LOS: 2 days A FACE TO FACE EVALUATION WAS PERFORMED  Charlett Blake 01/25/2018, 9:26 AM

## 2018-01-25 NOTE — Progress Notes (Signed)
Physical Therapy Session Note  Patient Details  Name: Ricky RombergWilliam Ray Wyka Jr. MRN: [id number] Date of Birth: Nov 28, 1955  Today's Date: 01/25/2018 PT Individual Time: 0806-0901 and 1450-1530 PT Individual Time Calculation (min): 55 min and 40 min  Short Term Goals: Week 1:  PT Short Term Goal 1 (Week 1): STG = LTG due to short ELOS.  Skilled Therapeutic Interventions/Progress Updates:  Treatment 1: Pt received in bed & agreeable to tx. Pt ambulates throughout unit without AD & supervision. Pt performs the following exercises for strengthening & NMR: - Side steps at rail with green theraband for added resistance. - Side steps at rail with green theraband with added static squat throughout.  - bird/dog exercises with supervision on mat on floor (pt reports back pain with task 2/2 previous fall & modifications made & rest breaks taken PRN) - single leg bridging on each LE - floor transfer with supervision - lateral step ups on 6" step with LUE then no UE support with CGA - negotiates 8 steps (6") without rails and min assist 2/2 decreased balance when descending stairs - negotiates 8 steps (3") without rails and min assist; pt reports 3" steps are more like his at home - LLE single leg stance without BUE support and min<>mod assist for balance - LLE single leg stance mini squats with min assist for balance and LUE support for LLE strengthening & NMR - ambulate while engaging in ball toss & cognitive task (naming [place]) with supervision for balance  Pt reports his son plans to stay with him for first few days upon d/c home which therapist recommends for increased safety with stair negotiation into home without rails and to help pt care for pets. At end of session pt left in bed in room with alarm set & needs in reach.  Treatment 2: Pt received in bed & agreeable to tx, denying c/o pain. Pt ambulates around unit without AD & distant supervision. Utilized Biodex Limits of Stability without  BUE support & close supervision; pt with improving accuracy from 24% > 32% > 34% during task. Pt utilized cybex kinetron in standing without BUE support and task focusing on weight shifting L<>R, L NMR, BLE strengthening, and dynamic balance with close supervision<>CGA. Pt utilized nu-step on level 6 x 10 minutes with all four extremities with task focusing on endurance training; pt reports 6/10 RPE. Educated pt on CGA recommendations for stair negotiation from son upon d/c home; educated him on HHPT f/u, risks of another stroke and day of d/c plan. At end of session pt left in bed with alarm set & needs at hand.  Therapy Documentation Precautions:  Precautions Precautions: Fall Restrictions Weight Bearing Restrictions: No    Therapy/Group: Individual Therapy  Sandi MariscalVictoria M Maude Gloor 01/25/2018, 3:36 PM

## 2018-01-25 NOTE — Progress Notes (Signed)
Occupational Therapy Session Note  Patient Details  Name: Ricky RombergWilliam Ray Hileman Jr. MRN: [id number] Date of Birth: 1955/07/20  Today's Date: 01/25/2018 OT Individual Time: [id number] and 1305-1350 OT Individual Time Calculation (min): 42 min and 45 min   Short Term Goals: Week 1:  OT Short Term Goal 1 (Week 1): STG = LTGs due to short ELOS  Skilled Therapeutic Interventions/Progress Updates:    1) Treatment session with focus on ADL retraining with bathing and dressing at sit > stand level.  Pt gathered clothing prior to ambulating to room shower.  Pt completed all mobility and bathing/dressing at overall supervision level.  Pt prefers to complete bathing at sit > stand level for increased endurance and energy conservation.  Pt completed grooming in standing at sink.  Pt returned to supine at end of session and left with all needs in reach.  2) Treatment session with focus on dynamic standing balance as needed for IADL tasks.  Pt ambulated to therapy gym without AD at Mod I level.  Engaged in reaching activity to obtain items from various heights in cabinet to simulate meal prep/housekeeping tasks.  Discussed setup and routine of feeding animals with pt able to obtain food bowls from floor and pick up weighted box to simulate cat litter and transport throughout gym.  Pt demonstrated good techniques to increase safety with tasks.  Pt transported cups stacked on foam pad with BUE while ambulating through moderately crowded gym, pt with no LOB or dropping of cups.  Ambulated while tossing ball overhead and bouncing ball to challenge balance while also completing cognitive task.  Returned to room and left seated EOB with all needs in reach.  Therapy Documentation Precautions:  Precautions Precautions: Fall Restrictions Weight Bearing Restrictions: No Pain: Pain Assessment Pain Scale: 0-10 Pain Score: 0-No pain   Therapy/Group: Individual Therapy  Tranquilino Fischler 01/25/2018, 1:10 PM

## 2018-01-25 NOTE — Discharge Summary (Addendum)
Physician Discharge Summary  Patient ID: Ricky RombergWilliam Ray Bugge Jr. MRN: [id number] DOB/AGE: July 21, 1955 62 y.o.  Admit date: 01/23/2018 Discharge date: 01/25/2018  Discharge Diagnoses:  Principal Problem:   Posterior circulation stroke [facility](HCC) Active Problems:   Type 2 diabetes mellitus with vascular disease (HCC)   Hyperlipidemia   Essential hypertension   Discharged Condition: stable   Significant Diagnostic Studies:   Labs:  Basic Metabolic Panel: BMP Latest Ref Rng & Units 01/24/2018 01/21/2018 01/20/2018  Glucose 70 - 99 mg/dL 329(J130(H) 188(C124(H) 166(A122(H)  BUN 8 - 23 mg/dL 6(L) 6(L) 10  Creatinine 0.61 - 1.24 mg/dL 6.300.95 1.601.01 1.090.91  Sodium 135 - 145 mmol/L 139 138 139  Potassium 3.5 - 5.1 mmol/L 3.7 3.6 3.3(L)  Chloride 98 - 111 mmol/L 102 104 104  CO2 22 - 32 mmol/L 26 22 22   Calcium 8.9 - 10.3 mg/dL 9.3 3.2(T8.8(L) 5.5(D8.8(L)    CBC: CBC Latest Ref Rng & Units 01/24/2018 01/21/2018 01/20/2018  WBC 4.0 - 10.5 K/uL 6.6 6.3 6.2  Hemoglobin 13.0 - 17.0 g/dL 32.216.4 02.515.4 42.716.1  Hematocrit 39.0 - 52.0 % 49.0 46.1 46.4  Platelets 150 - 400 K/uL 267 254 259    CBG: Recent Labs  Lab 01/26/18 1621 01/26/18 2112 01/27/18 0645  GLUCAP 100* 112* 116*    Brief HPI:   Ricky Bright is a 62 year old male with history of HTN, T2DM, hyperlipidemia, medication non-compliance for > 1 year who was admitted on 01/19/18 with reports of fall off his truck 3 weeks PTA and one week history of problems walking and dizziness. MRI brain done revealing multifocal bilateral infarcts in B-cerebellum, right occipital lobe, right thalamic and right caudate nucleus strokes. CTA head showed right vertebral artery occlusion at upper cervical region to foramen magnum level probably recent occlusion explaining embolic disease with posterior circulation and 80% or greater stenosis left V4 segment.    Stroke felt to be embolic of unclear etiology--could be related to fall with vertebral dissection versus chronic [facility]  stenosis/occlusion versus undiagnosed A. fib.  Dr. Roda ShuttersXu recommended DAPT x3 weeks followed by aspirin alone.  TEE done revealing mild to moderate LV hypertrophy with EF of 55 to 60% and small PFO.  Loop recorder placed to rule out A. fib.  BLE Dopplers were negative for DVT.  Patient continued to be limited by balance deficits as well as feelings of dizziness. CIR recommended for follow up therapy.    Hospital Course: Ricky RombergWilliam Ray Gowell Jr. was admitted to rehab 01/23/2018 for inpatient therapies to consist of PT and OT at least three hours five days a week. Past admission physiatrist, therapy team and rehab RN have worked together to provide customized collaborative inpatient rehab. Blood pressures have been monitored on bid basis with permissive HTN initially to prevent hypoperfusion. Blood pressure controle has improved on Amlodipine and Norvasc.  He was maintained on aspirin for secondary stroke prevention with Plavix end date Jan 8th.  Diabetes has been controlled with CM diet and BS have been relatively controlled off medications. Hgb A1c-  6.3 and patient has been without medications for about 1/5 years. Patient and family educated on importance of follow up with [facility] or primary care provider locally for medical care. He has progressed to modified independent level wihtout  AD. Berg balance score is 45/56 and he will continue to [facility].    Rehab course: During patient's stay in rehab weekly brief team conference was  held to monitor patient's progress, set goals and discuss barriers to discharge. At admission, patient required min assist with ADL tasks and mobility  He  has had improvement in activity tolerance, balance, postural control as well as ability to compensate for deficits.  He is able to complete ADL tasks at modified independent level.  He is modified independent for transfers and is able to ambulate 150 feet without an assistive device.   He requires supervision to navigate 12 stairs.    Disposition:  Home  Diet: Heart Healthy/Carb modified diet.   Special Instructions: 1. No driving or strenuous activity till cleared by MD.    Discharge Instructions    Ambulatory referral to Physical Medicine Rehab   Complete by:  As directed    1-2 weeks transitional care appt     Allergies as of 01/27/2018      Reactions   Trazodone And Nefazodone Shortness Of Breath      Medication List    TAKE these medications   acetaminophen 325 MG tablet Commonly known as:  TYLENOL Take 2 tablets (650 mg total) by mouth every 6 (six) hours as needed for mild pain.   amLODipine 10 MG tablet Commonly known as:  NORVASC Take 1 tablet (10 mg total) by mouth daily.   aspirin 81 MG EC tablet Take 1 tablet (81 mg total) by mouth daily. Notes to patient:  You will be on this life long to prevent further strokes.    atorvastatin 80 MG tablet Commonly known as:  LIPITOR Take 1 tablet (80 mg total) by mouth daily at 6 PM.   clopidogrel 75 MG tablet Commonly known as:  PLAVIX Take 1 tablet (75 mg total) by mouth daily. Notes to patient:  You have enough pills to complete 21 day course.    hydroxypropyl methylcellulose / hypromellose 2.5 % ophthalmic solution Commonly known as:  ISOPTO TEARS / GONIOVISC Place 1 drop into both eyes daily as needed for dry eyes.   ICY HOT EX Apply 1 application topically daily as needed (muscle pain).   lisinopril 10 MG tablet Commonly known as:  PRINIVIL,ZESTRIL Take 1 tablet (10 mg total) by mouth 2 (two) times daily. What changed:  when to take this      Follow-up Information    Kirsteins, Victorino Sparrow, MD Follow up.   Specialty:  Physical Medicine and Rehabilitation Why:  Office will call you with follow up appointment Contact information: [address] [place] [address] [phone]        Bing Neighbors, [name]. Go on 02/14/2018.   Specialty:  Family Medicine Why:  @  10:30 AM Contact information: [address] [place] [address] [phone]        [facility]. Call.   Specialty:  Neurology Why:  for follow up appointment Contact information: [address] [place] [address] [phone]          Signed: Jacquelynn Cree 02/02/2018, 3:58 PM

## 2018-01-26 ENCOUNTER — Inpatient Hospital Stay (HOSPITAL_COMMUNITY): Admitting: Occupational Therapy

## 2018-01-26 ENCOUNTER — Inpatient Hospital Stay (HOSPITAL_COMMUNITY)

## 2018-01-26 LAB — GLUCOSE, CAPILLARY
GLUCOSE-CAPILLARY: 121 mg/dL — AB (ref 70–99)
Glucose-Capillary: 119 mg/dL — ABNORMAL HIGH (ref 70–99)
Glucose-Capillary: 100 mg/dL — ABNORMAL HIGH (ref 70–99)
Glucose-Capillary: 112 mg/dL — ABNORMAL HIGH (ref 70–99)

## 2018-01-26 NOTE — Progress Notes (Signed)
Occupational Therapy Session Note  Patient Details  Name: Ricky RombergWilliam Ray Haring Jr. MRN: [id number] Date of Birth: Feb 06, 1956  Today's Date: 01/26/2018 OT Individual Time: 1005-1107 OT Individual Time Calculation (min): 62 min    Short Term Goals: Week 1:  OT Short Term Goal 1 (Week 1): STG = LTGs due to short ELOS  Skilled Therapeutic Interventions/Progress Updates:    Treatment session with focus on dynamic standing balance, balance reactions, and mobility in community setting.  Pt received in handoff from PT.  Pt already dressed and reported dressing without any assistance.  Pt ambulated down to gift shop without any assistance.  Engaged in scavenger hunt in gift shop to locate 5 items that therapist tasked pt to recall from unit.  Pt demonstrating good recall strategies and even able to find way back to unit utilizing signs and recall.  Engaged in basketball activity to challenge balance reactions to toss and retrieve basketball when hoop missed.  Pt ambulated around unit balancing small ball on tennis racket in Lt hand, pt able to sit and stand while balancing ball.  Returned to bed and made Mod I in room.  Therapy Documentation Precautions:  Precautions Precautions: Fall Restrictions Weight Bearing Restrictions: No Pain: Pain Assessment Pain Scale: 0-10 Pain Score: 0-No pain   Therapy/Group: Individual Therapy  Rosalio LoudHOXIE, Taahir Grisby 01/26/2018, 3:45 PM

## 2018-01-26 NOTE — Progress Notes (Signed)
[facility] Individual Statement of Services  Patient Name:  Ricky RombergWilliam Ray Okuda Jr.  Date:  01/26/2018  Welcome to the [facility].  Our goal is to provide you with an individualized program based on your diagnosis and situation, designed to meet your specific needs.  With this comprehensive rehabilitation program, you will be expected to participate in at least 3 hours of rehabilitation therapies Monday-Friday, with modified therapy programming on the weekends.  Your rehabilitation program will include the following services:  Physical Therapy (PT), Occupational Therapy (OT), Speech Therapy (ST), 24 hour per day rehabilitation nursing, Case Management (Social Worker), Rehabilitation Medicine, Nutrition Services and Pharmacy Services  Weekly team conferences will be held on Wednesdays to discuss your progress.  Your Social Worker will talk with you frequently to get your input and to update you on team discussions.  Team conferences with you and your family in attendance may also be held.  Expected length of stay: 5 to 7 days  Overall anticipated outcome: Independent with assistive device overall, supervision for ambulating in the community, contact guard for stairs  Depending on your progress and recovery, your program may change. Your Social Worker will coordinate services and will keep you informed of any changes. Your Social Worker's name and contact numbers are listed  below.  The following services may also be recommended but are not provided by the [facility]:   Driving Evaluations  Home Health Rehabiltiation Services  Outpatient Rehabilitation Services   Arrangements will be made to provide these services after discharge if needed.  Arrangements include referral to agencies that provide these services.  Your insurance has been verified to be:  [employer] [facility] Your primary doctor is:  Ricky CourtsKimberly Harris, [name] locally and [place]  [place]  Pertinent information will be shared with your doctor and your insurance company.  Social Worker:  Staci AcostaJenny Ami Mally, LCSW  [phone] or ([phone]) [phone]  Information discussed with and copy given to patient by: Elvera LennoxPrevatt, Kiela Shisler Capps, 01/26/2018, 4:14 PM

## 2018-01-26 NOTE — Progress Notes (Signed)
Physical Therapy Discharge Summary  Patient Details  Name: Ricky Bright. MRN: [id number] Date of Birth: 03/04/1955  Today's Date: 01/26/2018   -     Patient has met 8 of 8 long term goals due to improved activity tolerance, improved balance, ability to compensate for deficits and functional use of  left lower extremity.  Patient to discharge at an ambulatory level Independent.   Patient's care partner unnecessary to provide any assistance at discharge.  Reasons goals not met: na  Recommendation:  Patient will benefit from ongoing skilled PT services in home health setting to continue to advance safe functional mobility, address ongoing impairments in high level balance, high level coordination, and minimize fall risk.  Equipment: No equipment provided  Reasons for discharge: treatment goals met and discharge from hospital  Patient/family agrees with progress made and goals achieved: Yes  PT Discharge  Pt has progressed during his very short stay in CIR.  He verbalizes that his balance is affected, and has been since the fall backwards from a truck several weeks ago. He would benefit from high level balance activities.   Precautions/Restrictions- falls   Pain Pain Assessment Pain Scale: 0-10 Pain Score: 0-No pain Vision/Perception - no changes    Cognition Orientation Level: Oriented X4 Memory: Appears intact Safety/Judgment: Appears intact Sensation Sensation Light Touch: Appears Intact(BLE) Proprioception: Appears Intact(BLE) Coordination Gross Motor Movements are Fluid and Coordinated: (slightly impaired LE) Fine Motor Movements are Fluid and Coordinated: Yes Motor  Motor Motor - Skilled Clinical Observations: generalized deconditioning Motor - Discharge Observations: WFLs  Mobility Bed Mobility Supine to Sit: Independent Sit to Supine: Independent Transfers Transfers: Stand Pivot Transfers Sit to Stand: Independent Stand Pivot Transfers:  Independent Transfer (Assistive device): None Locomotion  Gait Gait Assistance: Independent Gait Distance (Feet): 150 Feet Assistive device: None Gait Gait: Yes Gait Pattern: Impaired Gait Pattern: Decreased trunk rotation;Narrow base of support Gait velocity: 4/69'/sec High Level Ambulation High Level Ambulation: Backwards walking;Side stepping Side Stepping: indpendent Backwards Walking: supervision Stairs / Additional Locomotion Stairs: Yes Stairs Assistance: Supervision/Verbal cueing(safely independent on 2 stairs, before fatiguing) Stair Management Technique: No rails;Alternating pattern Number of Stairs: 12 Wheelchair Mobility Wheelchair Mobility: No  Trunk/Postural Assessment  Cervical Assessment Cervical Assessment: Within Functional Limits Thoracic Assessment Thoracic Assessment: Within Functional Limits Lumbar Assessment Lumbar Assessment: Within Functional Limits Postural Control Postural Control: Deficits on evaluation Righting Reactions:  ankle strategy bil; delayed hip strategy, insufficient stepping strategy Protective Responses: delayed  Balance Standardized Balance Assessment Standardized Balance Assessment: Berg Balance Test;Timed Up and Go Test(on 01/24/18) Berg Balance Test Sit to Stand: Able to stand without using hands and stabilize independently Standing Unsupported: Able to stand safely 2 minutes Sitting with Back Unsupported but Feet Supported on Floor or Stool: Able to sit safely and securely 2 minutes Stand to Sit: Sits safely with minimal use of hands Transfers: Able to transfer safely, minor use of hands Standing Unsupported with Eyes Closed: Able to stand 10 seconds with supervision Standing Ubsupported with Feet Together: Able to place feet together independently and stand for 1 minute with supervision From Standing, Reach Forward with Outstretched Arm: Can reach forward >12 cm safely (5") From Standing Position, Pick up Object from Floor:  Able to pick up shoe, needs supervision From Standing Position, Turn to Look Behind Over each Shoulder: Turn sideways only but maintains balance(due to pain in back ) Turn 360 Degrees: Able to turn 360 degrees safely but slowly Standing Unsupported, Alternately Place Feet on Step/Stool: Able to  stand independently and complete 8 steps >20 seconds Standing Unsupported, One Foot in Front: Able to plae foot ahead of the other independently and hold 30 seconds Standing on One Leg: Able to lift leg independently and hold 5-10 seconds Total Score: 45 Timed Up and Go Test TUG: Normal TUG Normal TUG (seconds): 10 Dynamic Sitting Balance Sitting balance - Comments: independnetn Dynamic Standing Balance Dynamic Standing - Balance Support: During functional activity Dynamic Standing - Level of Assistance: 7: Independent Dynamic Standing - Comments: transporting items while ambulating, floor transfer Extremity Assessment      RLE Assessment RLE Assessment: Within Functional Limits General Strength Comments: 5/5 LLE Assessment LLE Assessment: Within Functional Limits General Strength Comments: 5/5    Ricky Bright 01/26/2018, 5:09 PM

## 2018-01-26 NOTE — Progress Notes (Signed)
Social Work Patient ID: Ricky Ray Qadri Jr., male   DOB: 02/25/1955, 62 y.o.   MRN: [id number]   CSW met with pt and his brother 01-25-18 to update them on team conference discussion and targeted d/c of 01-27-18.  Pt was pleased with this, as was his brother.  Pt is concerned about how to care for elderly dog as he is behind baby gates.  Pt's brother is a vet and he will help pt troubleshoot this at pt's home.  CSW suggested pt asking his son to help him by installing rail for stairs and pt will consider this.  CSW to arrange HH and order DME.  Pt feels prepared to go home and will have his son with him for a few days, but thinks he will do fine on his own after that time.  CSW remains available to assist as needed.    

## 2018-01-26 NOTE — IPOC Note (Signed)
Overall Plan of Care (IPOC) Patient Details Name: Ricky Bright. MRN: [id number] DOB: 24-Nov-1955  Admitting Diagnosis: <principal problem not specified>  Hospital Problems: Active Problems:   Posterior circulation stroke (HCC)     Functional Problem List: Nursing    PT Balance, Behavior, Endurance, Motor, Safety  OT Balance, Endurance, Motor, Pain, Perception, Safety  SLP    TR         Basic ADL's: OT Grooming, Bathing, Dressing, Toileting     Advanced  ADL's: OT Simple Meal Preparation, Light Housekeeping     Transfers: PT Bed to Chair, Car, State Street CorporationFurniture, Civil Service fast [place]  OT Toilet, Research scientist (life sciences)Tub/Shower     Locomotion: PT Ambulation, Stairs     Additional Impairments: OT None  SLP        TR      Anticipated Outcomes Item Anticipated Outcome  Self Feeding Independent  Swallowing      Basic self-care  Mod I  Toileting  Mod I   Bathroom Transfers Mod I  Bowel/Bladder     Transfers  mod I with LRAD  Locomotion  mod I with LRAD  Communication     Cognition     Pain     Safety/Judgment      Therapy Plan: PT Intensity: Minimum of 1-2 x/day ,45 to 90 minutes PT Frequency: 5 out of 7 days PT Duration Estimated Length of Stay: 5-7 days OT Intensity: Minimum of 1-2 x/day, 45 to 90 minutes OT Frequency: 5 out of 7 days OT Duration/Estimated Length of Stay: 5-7 days      Team Interventions: Nursing Interventions    PT interventions Ambulation/gait training, Community reintegration, Fish farm managerDME/adaptive equipment instruction, Neuromuscular re-education, Psychosocial support, Stair training, UE/LE Strength taining/ROM, UE/LE Coordination activities, Therapeutic Activities, Discharge planning, Warden/rangerBalance/vestibular training, Disease management/prevention, Patient/family education, Splinting/orthotics, Therapeutic Exercise, Visual/perceptual remediation/compensation, Functional mobility training  OT Interventions Warden/rangerBalance/vestibular training, Community reintegration, Discharge  planning, Disease mangement/prevention, DME/adaptive equipment instruction, Functional mobility training, Neuromuscular re-education, Pain management, Patient/family education, Psychosocial support, Self Care/advanced ADL retraining, Therapeutic Activities, Therapeutic Exercise, UE/LE Strength taining/ROM, UE/LE Coordination activities, Visual/perceptual remediation/compensation  SLP Interventions    TR Interventions    SW/CM Interventions Discharge Planning, Psychosocial Support, Patient/Family Education   Barriers to Discharge MD  Medical stability and Lack of/limited family support  Nursing      PT Decreased caregiver support, Home environment access/layout pt lives alone, 2 steps to enter home without rails  OT      SLP      SW       Team Discharge Planning: Destination: PT-Home ,OT- Home , SLP-  Projected Follow-up: PT-Home health PT, OT-  Home health OT, Outpatient OT, SLP-  Projected Equipment Needs: PT-To be determined, OT- Tub/shower bench, SLP-  Equipment Details: PT- , OT-  Patient/family involved in discharge planning: PT- Patient,  OT-Patient, SLP-   MD ELOS: 4-6d Medical Rehab Prognosis:  Excellent Assessment:  62 year old male with history of HTN, T2DM, hyperlipidemia, medication noncompliance for the past few months; who was admitted on 01/19/2018 with reports of a fall off a truck 3 weeks prior to admission and 1 week history of problems problems when walking and dizziness.  History taken from chart review and patient.  MRI brain reviewed, showing multifocal infarcts.  Per report, multiple acute/subacute infarcts bilateral cerebellum, right occipital lobe, right thalamic and right caudate nucleus. CTA head neck showed right vertebral artery occlusion at upper cervical region the foramen magnum level probably recent occlusion explaining embolic disease with the posterior circulation and  80% or greater stenosis left V4 segment.   Now requiring 24/7 Rehab RN,MD, as well as  CIR level PT, OT and SLP.  Treatment team will focus on ADLs and mobility with goals set at Mod I  See Team Conference Notes for weekly updates to the plan of care

## 2018-01-26 NOTE — Progress Notes (Addendum)
Physical Therapy Session Note  Patient Details  Name: Ricky RombergWilliam Ray Basley Jr. MRN: [id number] Date of Birth: 03-28-1955  Today's Date: 01/26/2018 PT Individual Time: 0850-1005 PT Individual Time Calculation (min): 75 min   Short Term Goals: Week 1:  PT Short Term Goal 1 (Week 1): STG = LTG due to short ELOS.  Skilled Therapeutic Interventions/Progress Updates:   Pt resting in bed.  Modified independent bed mobility.  Gait throughout unit without AD, modified independent.  Gait training without AD, tossing small ball up/down as he ambulated, while counting backwards from 100 by 3s; no LOB and able to count accurately without pausing. Gait to return to room, carrying a board with 9 cups stacked in pyramid shape on it, without spillage, conversing. Pt had small LOB as he looked L and R to enter hallway, regained independently. Up/down  2 steps no rails, independent; up/down 12 steps without rails, supervision due to slight LOB recovered independently, when fatigued.  neuromuscular re-education via forced use using NUStep at level 5 x 5 minutes, for alternating reciprocal movement x bil UEs and bil LEs.  Terminal hip extension in tall kneeling, x 10.  Side lying for R/L hip abduction with flexed hips and knees, x12. Quadruped >< 2 point with contralateral limbs extended; less stability noted with L hip extension and R shoulder flexed.  Biodex balance challenges using wt shifting R><L and limits of stability, with UEs crossed over chest.  Simulated car transfer modified independent.  Given external perturbations, pt demonstrated bil ankle strategy and bil stepping strategy, no hip strategy.  With practice, hip strategy emerged.  Pt donned XL vinyl gloves, and cleaned mat in quadruped, safely. Floor transfer independent, using 1 hand on surface of nearby furniture to arise from 1/2 kneeling.  PT instructed pt in self stretching bil heel cords in standing, x 30 seconds x 3.  PT recommended to him  that he do this QD.  Discussed fitness for life with pt.  He stated that he used to do martial arts; PT recommended he try that again, slowly, or else Yoga or TaiChi.   Sarah, OT took over pt at end of session.    Therapy Documentation Precautions:  Precautions Precautions: Fall Restrictions Weight Bearing Restrictions: No Pain: pt denies     Therapy/Group: Individual Therapy  Ricky Bright 01/26/2018, 10:15 AM

## 2018-01-26 NOTE — Discharge Instructions (Signed)
Inpatient Rehab Discharge Instructions  Ricky RombergWilliam Ray Digman Jr. Discharge date and time:    Activities/Precautions/ Functional Status: Activity: no lifting, driving, or strenuous exercise till cleared by MD Diet: cardiac diet Wound Care: none needed   Functional status:  ___ No restrictions     ___ Walk up steps independently ___ 24/7 supervision/assistance   ___ Walk up steps with assistance ___ Intermittent supervision/assistance  ___ Bathe/dress independently ___ Walk with walker     ___ Bathe/dress with assistance ___ Walk Independently    ___ Shower independently ___ Walk with assistance    ___ Shower with assistance _X__ No alcohol     ___ Return to work/school ________  COMMUNITY REFERRALS UPON DISCHARGE:   Home Health:   PT     OT  [facility]  [facility] Phone:  [phone] Medical Equipment/Items Ordered:  Tub transfer bench  Agency/Supplier:  [facility]       Phone:  [phone]  GENERAL COMMUNITY RESOURCES FOR PATIENT/FAMILY: Support Groups:  [facility] Meets the second Thursday of each month from 6 - 7 PM (except June, July, August) The . [facility], [address], [facility] Day[facility] For more information, call [phone]  Special Instructions:   STROKE/TIA DISCHARGE INSTRUCTIONS SMOKING Cigarette smoking nearly doubles your risk of having a stroke & is the single most alterable risk factor  If you smoke or have smoked in the last 12 months, you are advised to quit smoking for your health.  Most of the excess cardiovascular risk related to smoking disappears within a year of stopping.  Ask you doctor about anti-smoking medications  [place] Quit Line: 1-800-QUIT NOW  Free Smoking Cessation Classes [phone]  CHOLESTEROL Know your levels; limit fat & cholesterol in your diet  Lipid Panel     Component Value Date/Time   CHOL 228 (H) 01/20/2018 0530   TRIG 111 01/20/2018 0530    HDL 16 (L) 01/20/2018 0530   CHOLHDL 14.3 01/20/2018 0530   VLDL 22 01/20/2018 0530   LDLCALC 190 (H) 01/20/2018 0530      Many patients benefit from treatment even if their cholesterol is at goal.  Goal: Total Cholesterol (CHOL) less than 160  Goal:  Triglycerides (TRIG) less than 150  Goal:  HDL greater than 40  Goal:  LDL (LDLCALC) less than 100   BLOOD PRESSURE American Stroke Association blood pressure target is less that 120/80 mm/Hg  Your discharge blood pressure is:  BP: 124/66  Monitor your blood pressure  Limit your salt and alcohol intake  Many individuals will require more than one medication for high blood pressure  DIABETES (A1c is a blood sugar average for last 3 months) Goal HGBA1c is under 7% (HBGA1c is blood sugar average for last 3 months)  Diabetes: Pre-diabetes    Lab Results  Component Value Date   HGBA1C 6.3 (H) 01/20/2018     Your HGBA1c can be lowered with medications, healthy diet, and exercise.  Check your blood sugar as directed by your physician  Call your physician if you experience unexplained or low blood sugars.  PHYSICAL ACTIVITY/REHABILITATION Goal is 30 minutes at least 4 days per week  Activity: No driving, Therapies: see above Return to work: N/A  Activity decreases your risk of heart attack and stroke and makes your heart stronger.  It helps control your weight and blood pressure; helps you relax and can improve your mood.  Participate in a regular  exercise program.  Talk with your doctor about the best form of exercise for you (dancing, walking, swimming, cycling).  DIET/WEIGHT Goal is to maintain a healthy weight  Your discharge diet is:  Diet Order            Diet Carb Modified Fluid consistency: Thin; Room service appropriate? Yes  Diet effective now             liquids Your height is:  Height: 6' (182.9 cm) Your current weight is: Weight: 99.2 kg Your Body Mass Index (BMI) is:  BMI (Calculated): 29.65   Following the type of diet specifically designed for you will help prevent another stroke.  Your goal weight is:  184 lbs  Your goal Body Mass Index (BMI) is 19-24.  Healthy food habits can help reduce 3 risk factors for stroke:  High cholesterol, hypertension, and excess weight.  RESOURCES Stroke/Support Group:  Call [phone]   STROKE EDUCATION PROVIDED/REVIEWED AND GIVEN TO PATIENT Stroke warning signs and symptoms How to activate emergency medical system (call 911). Medications prescribed at discharge. Need for follow-up after discharge. Personal risk factors for stroke. Pneumonia vaccine given:  Flu vaccine given:  My questions have been answered, the writing is legible, and I understand these instructions.  I will adhere to these goals & educational materials that have been provided to me after my discharge from the hospital.    My questions have been answered and I understand these instructions. I will adhere to these goals and the provided educational materials after my discharge from the hospital.  Patient/Caregiver Signature _______________________________ Date __________  Clinician Signature _______________________________________ Date __________  Please bring this form and your medication list with you to all your follow-up doctor's appointments.

## 2018-01-26 NOTE — Progress Notes (Signed)
[facility] PHYSICAL MEDICINE & REHABILITATION PROGRESS NOTE   Subjective/Complaints:  No issues overnite, pt without diarrhea.  Discussed D/C date and no driving  ROS: Patient without CP, SOB , N/V/D  Objective:   No results found. Recent Labs    01/24/18 0508  WBC 6.6  HGB 16.4  HCT 49.0  PLT 267   Recent Labs    01/24/18 0508  NA 139  K 3.7  CL 102  CO2 26  GLUCOSE 130*  BUN 6*  CREATININE 0.95  CALCIUM 9.3    Intake/Output Summary (Last 24 hours) at 01/26/2018 0832 Last data filed at 01/26/2018 0813 Gross per 24 hour  Intake 1200 ml  Output 2275 ml  Net -1075 ml     Physical Exam: Vital Signs Blood pressure 129/73, pulse (!) 52, temperature 98 F (36.7 C), resp. rate 18, height 6' (1.829 m), weight 99.2 kg, SpO2 98 %. Constitutional: No distress . Vital signs reviewed. obese HEENT: EOMI, oral membranes moist Neck: supple Cardiovascular: RRR without murmur. No JVD    Respiratory: CTA Bilaterally without wheezes or rales. Normal effort    GI: BS +, non-tender, non-distended  Musculoskeletal:     Comments: No edema or tenderness in extremities  Neurological: He is alert and oriented to person, place, and time. Displays reasonable insight and attention Motor: 5/5 in all 4's Sensation intact light touch in all 4 limbs.  Mild left upper and lower limb ataxia.  Skin: Skin is warm and dry.  Psychiatric: flat but generally appropriate.     Assessment/Plan: 1. Functional deficits secondary to bilateral, multifocal posterior circulation infarcts which require 3+ hours per day of interdisciplinary therapy in a comprehensive inpatient rehab setting.  Physiatrist is providing close team supervision and 24 hour management of active medical problems listed below.  Physiatrist and rehab team continue to assess barriers to discharge/monitor patient progress toward functional and medical goals  Care Tool:  Bathing    Body parts bathed by patient: Right arm,  Left arm, Chest, Abdomen, Front perineal area, Buttocks, Right upper leg, Left upper leg, Face, Right lower leg, Left lower leg         Bathing assist Assist Level: Supervision/Verbal cueing     Upper Body Dressing/Undressing Upper body dressing   What is the patient wearing?: Hospital gown only    Upper body assist Assist Level: Minimal Assistance - Patient > 75%    Lower Body Dressing/Undressing Lower body dressing      What is the patient wearing?: Incontinence brief     Lower body assist Assist for lower body dressing: Minimal Assistance - Patient > 75%     Toileting Toileting    Toileting assist Assist for toileting: Minimal Assistance - Patient > 75%     Transfers Chair/bed transfer  Transfers assist     Chair/bed transfer assist level: Contact Guard/Touching assist     Locomotion Ambulation   Ambulation assist      Assist level: Supervision/Verbal cueing Assistive device: (none) Max distance: 150 ft    Walk 10 feet activity   Assist     Assist level: Supervision/Verbal cueing Assistive device: Other (comment)(none )   Walk 50 feet activity   Assist    Assist level: Supervision/Verbal cueing Assistive device: Other (comment)(none )    Walk 150 feet activity   Assist    Assist level: Supervision/Verbal cueing      Walk 10 feet on uneven surface  activity   Assist     Assist  level: Supervision/Verbal cueing     Wheelchair     Assist Will patient use wheelchair at discharge?: No   Wheelchair activity did not occur: N/A         Wheelchair 50 feet with 2 turns activity    Assist    Wheelchair 50 feet with 2 turns activity did not occur: N/A       Wheelchair 150 feet activity     Assist Wheelchair 150 feet activity did not occur: N/A        Medical Problem List and Plan: 1. Balance deficits as well as feeling of lightheadedness with positional changes secondary to bilateral, multi-focal  posterior circulation infarcts (R>L)  -CIR PT, OT- plan d/c 12/20 2.  DVT Prophylaxis/Anticoagulation: Pharmaceutical: Lovenox 3. Pain Management: Tylenol as needed 4. Mood: Use LCSW to follow for evaluation and support 5. Neuropsych: This patient is capable of making decisions on his own behalf. 6. Skin/Wound Care: Routine pressure leave mesh 7. Fluids/Electrolytes/Nutrition: appetite appears excellent so far.   -I personally reviewed the patient's labs today.   8.  Hypertension:    Continue amlodipine and lisinopril.   Vitals:   01/25/18 2003 01/26/18 0455  BP: 121/66 129/73  Pulse: (!) 59 (!) 52  Resp: 18 18  Temp: 98.8 F (37.1 C) 98 F (36.7 C)  SpO2: 98% 98%  controlled 12/19 9. T2DM: Hemoglobin A1c-6.3.    sliding scale insulin for tighter control.    CBG (last 3)  Recent Labs    01/25/18 1721 01/25/18 2129 01/26/18 0639  GLUCAP 109* 176* 119*  controlled 12/19 10.  Dyslipidemia: Now on Lipitor 11. Constipation: multiple loose stools  -hold senna-s    LOS: 3 days A FACE TO FACE EVALUATION WAS PERFORMED  Erick Colacendrew E Kirsteins 01/26/2018, 8:32 AM

## 2018-01-26 NOTE — Patient Care Conference (Addendum)
Inpatient RehabilitationTeam Conference and Plan of Care Update Date: 01/25/2018   Time: 10:50 AM    Patient Name: Donneta RombergWilliam Ray Hazen Jr.      Medical Record Number: [id number]  Date of Birth: 1955/07/10 Sex: Male         Room/Bed: 4W20C/4W20C-01 Payor Info: Payor: TRICARE / Plan: TRICARE [place] / Product Type: *No Product type* /    Admitting Diagnosis: CVA  Admit Date/Time:  01/23/2018  6:43 PM Admission Comments: No comment available   Primary Diagnosis:  <principal problem not specified> Principal Problem: <principal problem not specified>  Patient Active Problem List   Diagnosis Date Noted  . Posterior circulation stroke (HCC) 01/23/2018  . Dyslipidemia   . Essential hypertension   . Constipation, slow transit   . Acute CVA (cerebrovascular accident) (HCC) 01/19/2018  . Hypertensive urgency 01/19/2018  . Type 2 diabetes mellitus with vascular disease (HCC) 01/19/2018  . Hyperlipidemia 01/19/2018  . CVA (cerebral vascular accident) (HCC) 01/19/2018    Expected Discharge Date: Expected Discharge Date: 01/27/18  Team Members Present: Physician leading conference: Dr. Claudette LawsAndrew Kirsteins Social Worker Present: Staci AcostaJenny Olimpia Tinch, LCSW Nurse Present: Tennis MustLuz Rosero, RN PT Present: Aleda GranaVictoria Miller, PT OT Present: Rosalio LoudSarah Hoxie, OT SLP Present: Colin BentonMadison Cratch, SLP PPS Coordinator present : Fae PippinMelissa Bowie     Current Status/Progress Goal Weekly Team Focus  Medical   Patient with balance disorder, difficulty on steps, variable heart rate  Reduce fall risk, improve step climbing ability  Initiate rehab program,   Bowel/Bladder   Continent of B/B, lbm 01/23/18  Maintain continence  QS assessment of toileting needs   Swallow/Nutrition/ Hydration             ADL's   Min assist LB dressing, contact guard mobility and bathing  Mod I  dynamic standing balance, endurance, IADLs   Mobility   min assist without AD except mod assist for stairs  mod I except supervision for floor transfer,  community ambulation & stair negotiation without rails  NMR, balance, stairs, floor transfer, gait, endurance, pt education & d/c planning   Communication             Safety/Cognition/ Behavioral Observations            Pain   Denies Pain   < 2  Assess QS and PRN    Skin   No skin issues  Maintain skin   QS and pen assessment    Rehab Goals Patient on target to meet rehab goals: Yes Rehab Goals Revised: none *See Care Plan and progress notes for long and short-term goals.     Barriers to Discharge  Current Status/Progress Possible Resolutions Date Resolved   Physician    Inaccessible home environment;Medical stability     Progressing  Continue rehab evaluations and treatment, medication management      Nursing                  PT  Decreased caregiver support;Home environment access/layout  pt lives alone, 2 steps to enter home without rails              OT                  SLP                SW                Discharge Planning/Teaching Needs:  Pt plans to return to his home and will have his son  there for a couple of days to help him transition home.  Pt with mod I goals and is independent to direct any care he may need.   Team Discussion:  Pt with post circulation infarcts.  He was healthy and independent PTA, running, hiking, etc.  Pt doing well functionally and medically.  He does not have hand rails for 2 small stairs at home, so therapists recommend rail or assistance with stairs initially.  Pt is supervision/min guard with OT and they are working on high level standing balance.      Revisions to Treatment Plan:  none    Continued Need for Acute Rehabilitation Level of Care: The patient requires daily medical management by a physician with specialized training in physical medicine and rehabilitation for the following conditions: Daily direction of a multidisciplinary physical rehabilitation program to ensure safe treatment while eliciting the highest outcome that is  of practical value to the patient.: Yes Daily medical management of patient stability for increased activity during participation in an intensive rehabilitation regime.: Yes Daily analysis of laboratory values and/or radiology reports with any subsequent need for medication adjustment of medical intervention for : Neurological problems;Diabetes problems   I attest that I was present, lead the team conference, and concur with the assessment and plan of the team.   Haydee Jabbour, Vista DeckJennifer Capps 01/26/2018, 3:45 PM

## 2018-01-26 NOTE — Progress Notes (Signed)
Occupational Therapy Session Note  Patient Details  Name: Ricky RombergWilliam Ray Pitsenbarger Jr. MRN: [id number] Date of Birth: [birth year]  Today's Date: 01/26/2018 OT Individual Time: 1400-1445 OT Individual Time Calculation (min): 45 min    Short Term Goals: Week 1:  OT Short Term Goal 1 (Week 1): STG = LTGs due to short ELOS  Skilled Therapeutic Interventions/Progress Updates:    Patient in bed and ready for therapy session this afternoon.  He denies pain and states that he is eager to get home tomorrow.  Reviewed plan for pet care and potential obstacles in home environment.  He demonstrates a good understanding and verbal safety at this time.   Patient completes functional transfers and functional ambulation without AD mod I level.  Walked > 500 ft without difficulty.   Patient notes that vision was initially impaired - ROM/saccades in seated position WFLs, completed walking side to side and up/down saccades with no LOB or complaints.  VOR side to side slow but intact, some difficulty with up/down direction. Completed reaction time assessment with [employer]:  Trial 1 - standing for 2 minutes both hands = 1.3 sec reaction time      Trial 2 - standing for 4 minutes (right hand to red and left hand to green targets) = 95% accuracy R = 1.45 sec, L = 1.44 sec Patient returned to room/bed for rest break at close of session and states that he feels prepared for discharge.    Therapy Documentation Precautions:  Precautions Precautions: Fall Restrictions Weight Bearing Restrictions: No General:   Vital Signs:  Pain: Pain Assessment Pain Scale: 0-10 Pain Score: 0-No pain   Therapy/Group: Individual Therapy  Barrie LymeStacey A Justus Duerr 01/26/2018, 3:49 PM

## 2018-01-26 NOTE — Progress Notes (Signed)
Occupational Therapy Discharge Summary  Patient Details  Name: Ricky Bright. MRN: [id number] Date of Birth: 1955/05/31  Patient has met 10 of 10 long term goals due to improved activity tolerance, improved balance, ability to compensate for deficits and improved awareness.  Patient to discharge at overall Independent level.  Patient's care partner is independent to provide the necessary intermittent assistance at discharge.  Pt's son plans to stay with pt initially upon d/c to ease transition to home.  Reasons goals not met: N/A  Recommendation:  Patient will benefit from ongoing skilled OT services in home health setting to continue to advance functional skills in the area of BADL, iADL and Reduce care partner burden.  Equipment: tub transfer bench  Reasons for discharge: treatment goals met and discharge from hospital  Patient/family agrees with progress made and goals achieved: Yes  OT Discharge Pain Pain Assessment Pain Scale: 0-10 Pain Score: 0-No pain ADL ADL Eating: Independent Grooming: Independent Upper Body Bathing: Modified independent Where Assessed-Upper Body Bathing: Shower Lower Body Bathing: Modified independent Where Assessed-Lower Body Bathing: Shower Upper Body Dressing: Independent Where Assessed-Upper Body Dressing: Edge of bed Lower Body Dressing: Independent Where Assessed-Lower Body Dressing: Edge of bed Toileting: Independent Where Assessed-Toileting: Glass blower/designer: Programmer, applications Method: Counselling psychologist: Energy manager: Modified independent Clinical cytogeneticist Method: Educational psychologist: Facilities manager: Modified independent Social research officer, government Method: Heritage manager: Radio broadcast assistant, Grab bars Vision Baseline Vision/History: Wears glasses Wears Glasses: Reading only Patient Visual Report: No change from  baseline;Diplopia Vision Assessment?: Yes Eye Alignment: Within Functional Limits Ocular Range of Motion: Within Functional Limits Alignment/Gaze Preference: Within Defined Limits Tracking/Visual Pursuits: Able to track stimulus in all quads without difficulty Saccades: Within functional limits Visual Fields: No apparent deficits Cognition Overall Cognitive Status: Within Functional Limits for tasks assessed Arousal/Alertness: Awake/alert Orientation Level: Oriented X4 Attention: Alternating Alternating Attention: Appears intact Memory: Appears intact Awareness: Appears intact Problem Solving: Appears intact Safety/Judgment: Appears intact Sensation Sensation Light Touch: Appears Intact Proprioception: Appears Intact Coordination Gross Motor Movements are Fluid and Coordinated: (slightly impaired LE) Fine Motor Movements are Fluid and Coordinated: Yes Finger Nose Finger Test: WNL Motor  Motor Motor - Skilled Clinical Observations: generalized deconditioning Motor - Discharge Observations: WFLs Mobility  Bed Mobility Supine to Sit: Independent Sit to Supine: Independent Transfers Sit to Stand: Independent  Trunk/Postural Assessment  Cervical Assessment Cervical Assessment: Within Functional Limits Thoracic Assessment Thoracic Assessment: Within Functional Limits Lumbar Assessment Lumbar Assessment: Within Functional Limits Postural Control Postural Control: Deficits on evaluation Righting Reactions:  ankle strategy bil; delayed hip strategy, insufficient stepping strategy Protective Responses: delayed  Balance Standardized Balance Assessment Standardized Balance Assessment: Berg Balance Test;Timed Up and Go Test(on 01/24/18) Berg Balance Test Sit to Stand: Able to stand without using hands and stabilize independently Standing Unsupported: Able to stand safely 2 minutes Sitting with Back Unsupported but Feet Supported on Floor or Stool: Able to sit safely and  securely 2 minutes Stand to Sit: Sits safely with minimal use of hands Transfers: Able to transfer safely, minor use of hands Standing Unsupported with Eyes Closed: Able to stand 10 seconds with supervision Standing Ubsupported with Feet Together: Able to place feet together independently and stand for 1 minute with supervision From Standing, Reach Forward with Outstretched Arm: Can reach forward >12 cm safely (5") From Standing Position, Pick up Object from Floor: Able to pick up shoe, needs supervision From Standing Position,  Turn to Look Behind Over each Shoulder: Turn sideways only but maintains balance(due to pain in back ) Turn 360 Degrees: Able to turn 360 degrees safely but slowly Standing Unsupported, Alternately Place Feet on Step/Stool: Able to stand independently and complete 8 steps >20 seconds Standing Unsupported, One Foot in Front: Able to plae foot ahead of the other independently and hold 30 seconds Standing on One Leg: Able to lift leg independently and hold 5-10 seconds Total Score: 45 Timed Up and Go Test TUG: Normal TUG Normal TUG (seconds): 10 Dynamic Sitting Balance Sitting balance - Comments: independnetn Dynamic Standing Balance Dynamic Standing - Balance Support: During functional activity Dynamic Standing - Level of Assistance: 7: Independent Dynamic Standing - Comments: transporting items while ambulating, floor transfer Extremity/Trunk Assessment RUE Assessment RUE Assessment: Within Functional Limits LUE Assessment LUE Assessment: Within Functional Limits   Quinto Tippy [facility] 01/26/2018, 8:55 PM

## 2018-01-27 DIAGNOSIS — I69393 Ataxia following cerebral infarction: Principal | ICD-10-CM

## 2018-01-27 LAB — GLUCOSE, CAPILLARY: Glucose-Capillary: 116 mg/dL — ABNORMAL HIGH (ref 70–99)

## 2018-01-27 MED ORDER — ATORVASTATIN CALCIUM 80 MG PO TABS: 80.0000 mg | ORAL_TABLET | Freq: Every day | ORAL | 0 refills | Status: DC

## 2018-01-27 MED ORDER — ASPIRIN 81 MG PO TBEC: 81.0000 mg | DELAYED_RELEASE_TABLET | Freq: Every day | ORAL | 1 refills | Status: DC

## 2018-01-27 MED ORDER — CLOPIDOGREL BISULFATE 75 MG PO TABS: 75.0000 mg | ORAL_TABLET | Freq: Every day | ORAL | 0 refills | Status: DC

## 2018-01-27 MED ORDER — LISINOPRIL 10 MG PO TABS: 10.0000 mg | ORAL_TABLET | Freq: Two times a day (BID) | ORAL | 0 refills | Status: DC

## 2018-01-27 MED ORDER — ACETAMINOPHEN 325 MG PO TABS: 650.0000 mg | ORAL_TABLET | Freq: Four times a day (QID) | ORAL | Status: DC | PRN

## 2018-01-27 MED ORDER — AMLODIPINE BESYLATE 10 MG PO TABS: 10.0000 mg | ORAL_TABLET | Freq: Every day | ORAL | 1 refills | Status: DC

## 2018-01-27 NOTE — Plan of Care (Signed)
  Problem: RH BOWEL ELIMINATION Goal: RH STG MANAGE BOWEL WITH ASSISTANCE Description STG Manage Bowel with Min  Assistance.   Outcome: Completed/Met Goal: RH STG MANAGE BOWEL W/MEDICATION W/ASSISTANCE Description STG Manage Bowel with Medication with  [place].   Outcome: Completed/Met   Problem: RH BLADDER ELIMINATION Goal: RH STG MANAGE BLADDER WITH ASSISTANCE Description STG Manage Bladder With Min Assistance   Outcome: Completed/Met   Problem: RH SKIN INTEGRITY Goal: RH STG SKIN FREE OF INFECTION/BREAKDOWN Outcome: Completed/Met   Problem: RH SAFETY Goal: RH STG ADHERE TO SAFETY PRECAUTIONS W/ASSISTANCE/DEVICE Description STG Adhere to Safety Precautions With  Min Assistance/Device.   Outcome: Completed/Met Goal: RH STG DECREASED RISK OF FALL WITH ASSISTANCE Description STG Decreased Risk of Fall With [employer].   Outcome: Completed/Met   Problem: RH PAIN MANAGEMENT Goal: RH STG PAIN MANAGED AT OR BELOW PT'S PAIN GOAL Description Pain <=2/10   Outcome: Completed/Met   Problem: RH KNOWLEDGE DEFICIT Goal: RH STG INCREASE KNOWLEDGE OF DIABETES Outcome: Completed/Met Goal: RH STG INCREASE KNOWLEDGE OF HYPERTENSION Outcome: Completed/Met Goal: RH STG INCREASE KNOWLEGDE OF HYPERLIPIDEMIA Outcome: Completed/Met Goal: RH STG INCREASE KNOWLEDGE OF STROKE PROPHYLAXIS Outcome: Completed/Met

## 2018-01-27 NOTE — Progress Notes (Signed)
Pt discharged to home with family. Discharge instructions given to pt by Pam love,. PA with verbal understanding.

## 2018-01-27 NOTE — Progress Notes (Signed)
[place] PHYSICAL MEDICINE & REHABILITATION PROGRESS NOTE   Subjective/Complaints:  No issues overnite  ROS: Patient without CP, SOB , N/V/D  Objective:   No results found. No results for input(s): WBC, HGB, HCT, PLT in the last 72 hours. No results for input(s): NA, K, CL, CO2, GLUCOSE, BUN, CREATININE, CALCIUM in the last 72 hours.  Intake/Output Summary (Last 24 hours) at 01/27/2018 0605 Last data filed at 01/26/2018 2020 Gross per 24 hour  Intake 240 ml  Output 950 ml  Net -710 ml     Physical Exam: Vital Signs Blood pressure (!) 153/87, pulse (!) 51, temperature 97.6 F (36.4 C), temperature source Oral, resp. rate 12, height 6' (1.829 m), weight 99.2 kg, SpO2 97 %. Constitutional: No distress . Vital signs reviewed. obese HEENT: EOMI, oral membranes moist Neck: supple Cardiovascular: RRR without murmur. No JVD    Respiratory: CTA Bilaterally without wheezes or rales. Normal effort    GI: BS +, non-tender, non-distended  Musculoskeletal:     Comments: No edema or tenderness in extremities  Neurological: He is alert and oriented to person, place, and time. Displays reasonable insight and attention Motor: 5/5 in all 4's Sensation intact light touch in all 4 limbs.  Mild left upper and lower limb ataxia.  Skin: Skin is warm and dry.  Psychiatric: flat but generally appropriate.     Assessment/Plan: 1. Functional deficits secondary to bilateral, multifocal posterior circulation infarcts Stable for D/C today F/u PCP in 3-4 weeks F/u PM&R 2 weeks See D/C summary See D/C instructions No driving until re eval at clinic follow up Care Tool:  Bathing    Body parts bathed by patient: Right arm, Left arm, Chest, Abdomen, Front perineal area, Buttocks, Right upper leg, Left upper leg, Face, Right lower leg, Left lower leg         Bathing assist Assist Level: Independent with assistive device     Upper Body Dressing/Undressing Upper body dressing   What is  the patient wearing?: Pull over shirt    Upper body assist Assist Level: Independent    Lower Body Dressing/Undressing Lower body dressing      What is the patient wearing?: Pants     Lower body assist Assist for lower body dressing: Independent     Toileting Toileting    Toileting assist Assist for toileting: Independent     Transfers Chair/bed transfer  Transfers assist     Chair/bed transfer assist level: Independent     Locomotion Ambulation   Ambulation assist      Assist level: Independent Assistive device: Other (comment)(none) Max distance: 200   Walk 10 feet activity   Assist     Assist level: Independent Assistive device: Other (comment)(none )   Walk 50 feet activity   Assist    Assist level: Independent Assistive device: Other (comment)(none )    Walk 150 feet activity   Assist    Assist level: Supervision/Verbal cueing      Walk 10 feet on uneven surface  activity   Assist     Assist level: Supervision/Verbal cueing     Wheelchair     Assist Will patient use wheelchair at discharge?: No   Wheelchair activity did not occur: N/A         Wheelchair 50 feet with 2 turns activity    Assist    Wheelchair 50 feet with 2 turns activity did not occur: N/A       Wheelchair 150 feet activity  Assist Wheelchair 150 feet activity did not occur: N/A        Medical Problem List and Plan: 1. Balance deficits as well as feeling of lightheadedness with positional changes secondary to bilateral, multi-focal posterior circulation infarcts (R>L)  -CIR PT, OT- plan d/c today 2.  DVT Prophylaxis/Anticoagulation: Pharmaceutical: Lovenox 3. Pain Management: Tylenol as needed 4. Mood: Use LCSW to follow for evaluation and support 5. Neuropsych: This patient is capable of making decisions on his own behalf. 6. Skin/Wound Care: Routine pressure leave mesh 7. Fluids/Electrolytes/Nutrition: appetite appears  excellent so far.   -I personally reviewed the patient's labs today.   8.  Hypertension:    Continue amlodipine and lisinopril.   Vitals:   01/26/18 2143 01/27/18 0519  BP: (!) 147/81 (!) 153/87  Pulse: (!) 52 (!) 51  Resp: 14 12  Temp: 98.2 F (36.8 C) 97.6 F (36.4 C)  SpO2: 97% 97%  systolic elevation 12/20- cont current dose given proximity to CVA and f/u with PCP 9. T2DM: Hemoglobin A1c-6.3.    sliding scale insulin for tighter control.    CBG (last 3)  Recent Labs    01/26/18 1122 01/26/18 1621 01/26/18 2112  GLUCAP 121* 100* 112*  controlled 12/20 10.  Dyslipidemia: Now on Lipitor 11. Constipation: multiple loose stools  -hold senna-s    LOS: 4 days A FACE TO FACE EVALUATION WAS PERFORMED  Erick Colacendrew E Krisa Blattner 01/27/2018, 6:05 AM

## 2018-01-28 NOTE — Progress Notes (Signed)
Social Work Assessment and Plan  Patient Details  Name: Ricky Bright. MRN: [id number] Date of Birth: 06-Nov-1955  Today's Date: 01/24/2018  Problem List:  Patient Active Problem List   Diagnosis Date Noted  . Posterior circulation stroke ([place]) 01/23/2018  . Dyslipidemia   . Essential hypertension   . Constipation, slow transit   . Acute CVA (cerebrovascular accident) ([place]) 01/19/2018  . Hypertensive urgency 01/19/2018  . Type 2 diabetes mellitus with vascular disease ([place]) 01/19/2018  . Hyperlipidemia 01/19/2018  . CVA (cerebral vascular accident) ([place]) 01/19/2018   Past Medical History:  Past Medical History:  Diagnosis Date  . Diabetes mellitus without complication ([place])   . HLD (hyperlipidemia)   . Hypercholesteremia   . Hypertension    Past Surgical History:  Past Surgical History:  Procedure Laterality Date  . APPENDECTOMY    . bicep tendon rupture.    [name] LOOP RECORDER INSERTION N/A 01/23/2018   Procedure: LOOP RECORDER INSERTION;  Surgeon: Evans Lance, MD;  Location: [place] [facility];  Service: Cardiovascular;  Laterality: N/A;  . TEE WITHOUT CARDIOVERSION N/A 01/23/2018   Procedure: TRANSESOPHAGEAL ECHOCARDIOGRAM (TEE);  Surgeon: Larey Dresser, MD;  Location: [facility] ENDOSCOPY;  Service: Cardiovascular;  Laterality: N/A;   Social History:  reports that he has never smoked. He has never used smokeless tobacco. He reports previous alcohol use. He reports that he does not use drugs.  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   Abuse/Neglect Abuse/Neglect Assessment Can Be Completed: Yes Physical Abuse: Denies Verbal Abuse: Denies Sexual Abuse: Denies Exploitation of patient/patient's resources: Denies Self-Neglect: Denies  Emotional Status Pt's affect, behavior and adjustment status: Pt is motivated to work hard and get home. Recent Psychosocial Issues: Pt just moved to a new home and fell off the back of the moving truck and he believes that is what started all of this. Psychiatric History: none reported Substance Abuse History: none reported  Patient / Family Perceptions, Expectations & Goals Pt/Family understanding of illness & functional limitations: Pt has a good understanding of his condition and limitations. Premorbid pt/family roles/activities: Pt likes to run, hike, fish, be outside, etc. Anticipated changes in roles/activities/participation: Pt would like to resume activities as he is able. Pt/family expectations/goals: Pt wants to get home and regain his independence.  Community Resources [employer] Scripts: None Premorbid Home Care/DME Agencies: None Transportation available at discharge: family Resource referrals recommended: Support group (specify)(Stroke support group)  Discharge Planning Living Arrangements: Alone Support Systems: Children, Water engineer, Other relatives Type of Residence: Private residence Insurance Resources: Multimedia programmer (specify)(Tricare [place]) Museum/gallery curator Resources: Social Product/process development scientist) Financial Screen Referred: No Money Management: Patient Does the patient have any problems obtaining your medications?: No Home Management: Pt does manages his home. Patient/Family Preliminary Plans: Pt plans to return to his new  home, with his son with him for a few days to help with the transition.   Social Work Anticipated Follow Up Needs: HH/OP, Support Group  Expected length of stay: 5 to 7 days  Clinical Impression CSW met with pt to introduce self and role of CSW, as well as to complete assessment.  Pt was very talkative with CSW and open about his life and condition.  Pt is very motivated to get better and get back to his dog and cat for whom he cares.  Pt is used to being independent, and is not used to relying on others.  Pt just moved into a new house in [place] and hasn't really even gotten completely settled and hopes to do that soon.  Pt is appreciative of CIR and will work hard to get home.  No current concerns/questions/needs at this time.  CSW will continue to follow and assist as needed.  Ricky Bright, Silvestre Mesi 01/25/2018, 1:14 PM

## 2018-01-28 NOTE — Progress Notes (Signed)
Social Work Discharge Note  The overall goal for the admission was met for:   Discharge location: Yes - pt's home  Length of Stay: Yes - 4 days  Discharge activity level: Yes - modified independent  Home/community participation: Yes  Services provided included: MD, RD, PT, OT, RN, Pharmacy and SW  Financial Services: Private Insurance: Dyanne Iha  Follow-up services arranged: Home Health: OT/PT from [facility], DME: tub transfer bench from [facility] and Patient/Family has no preference for HH/DME agencies  Comments (or additional information): Pt will have his son there with him for a few days as he transitions to being home.  Pt felt ready to go home.  Patient/Family verbalized understanding of follow-up arrangements: Yes  Individual responsible for coordination of the follow-up plan: pt  Confirmed correct DME delivered: Trey Sailors 01/28/2018    Regis Hinton, Silvestre Mesi

## 2018-01-30 ENCOUNTER — Encounter: Payer: Self-pay | Admitting: Physical Medicine & Rehabilitation

## 2018-02-03 ENCOUNTER — Ambulatory Visit (INDEPENDENT_AMBULATORY_CARE_PROVIDER_SITE_OTHER): Admitting: *Deleted

## 2018-02-03 DIAGNOSIS — I639 Cerebral infarction, unspecified: Secondary | ICD-10-CM

## 2018-02-03 NOTE — Progress Notes (Signed)
Wound Loop check in clinic. Steri-strips removed. Wound without redness or edema. Incision edges approximated, wound well healed. Battery status: Good. R-waves 0.8636mV. 0 symptom episodes, 0 tachy episodes, pause and brady off. 0 AF episodes. Monthly summary reports and ROV with GT PRN

## 2018-02-13 ENCOUNTER — Telehealth: Payer: Self-pay

## 2018-02-13 NOTE — Telephone Encounter (Signed)
I called the pt to request that he send a manual home remote appointment but we do not have a working number for him on file.

## 2018-02-13 NOTE — Telephone Encounter (Signed)
Spoke with patient's son, Lacretia Nicks. Haedyn Odle [facility]). He gave me patient's updated number.  Spoke with patient to obtain manual transmission. 2 "AF" episodes noted--ECGs appear ST w/PVCs and rare undersensing. ECGs placed in Dr. Valarie Cones folder for review. Patient advised that if any recommended changes, we will call him back. Patient verbalizes understanding and appreciation of call.

## 2018-02-14 ENCOUNTER — Ambulatory Visit (INDEPENDENT_AMBULATORY_CARE_PROVIDER_SITE_OTHER): Admitting: Family Medicine

## 2018-02-14 ENCOUNTER — Encounter: Payer: Self-pay | Admitting: Family Medicine

## 2018-02-14 VITALS — BP 169/99 | HR 72 | Resp 17 | Ht 72.0 in | Wt 225.8 lb

## 2018-02-14 DIAGNOSIS — E1159 Type 2 diabetes mellitus with other circulatory complications: Secondary | ICD-10-CM

## 2018-02-14 DIAGNOSIS — I10 Essential (primary) hypertension: Secondary | ICD-10-CM | POA: Diagnosis not present

## 2018-02-14 DIAGNOSIS — E785 Hyperlipidemia, unspecified: Secondary | ICD-10-CM

## 2018-02-14 DIAGNOSIS — Z8673 Personal history of transient ischemic attack (TIA), and cerebral infarction without residual deficits: Secondary | ICD-10-CM

## 2018-02-14 LAB — GLUCOSE, POCT (MANUAL RESULT ENTRY): POC Glucose: 125 mg/dl — AB (ref 70–99)

## 2018-02-14 MED ORDER — AMLODIPINE BESYLATE 10 MG PO TABS: 10.0000 mg | ORAL_TABLET | Freq: Every day | ORAL | 1 refills | Status: DC

## 2018-02-14 MED ORDER — BLOOD GLUCOSE MONITOR KIT: PACK | 0 refills | Status: DC

## 2018-02-14 MED ORDER — ATORVASTATIN CALCIUM 80 MG PO TABS: 80.0000 mg | ORAL_TABLET | Freq: Every day | ORAL | 1 refills | Status: DC

## 2018-02-14 MED ORDER — BLOOD GLUCOSE METER KIT: PACK | 0 refills | Status: AC

## 2018-02-14 MED ORDER — METFORMIN HCL 500 MG PO TABS: 500.0000 mg | ORAL_TABLET | Freq: Two times a day (BID) | ORAL | 3 refills | Status: DC

## 2018-02-14 MED ORDER — LISINOPRIL 10 MG PO TABS: 10.0000 mg | ORAL_TABLET | Freq: Two times a day (BID) | ORAL | 2 refills | Status: DC

## 2018-02-14 MED ORDER — ASPIRIN 81 MG PO TBEC: 81.0000 mg | DELAYED_RELEASE_TABLET | Freq: Every day | ORAL | 1 refills | Status: DC

## 2018-02-14 NOTE — Patient Instructions (Addendum)
Continue to monitor BP at home. Take medication as directed. I will see you back in 2 months.          Blood Glucose Monitoring, Adult Monitoring your blood sugar (glucose) is an important part of managing your diabetes (diabetes mellitus). Blood glucose monitoring involves checking your blood glucose as often as directed and keeping a record (log) of your results over time. Checking your blood glucose regularly and keeping a blood glucose log can:  Help you and your health care provider adjust your diabetes management plan as needed, including your medicines or insulin.  Help you understand how food, exercise, illnesses, and medicines affect your blood glucose.  Let you know what your blood glucose is at any time. You can quickly find out if you have low blood glucose (hypoglycemia) or high blood glucose (hyperglycemia). Your health care provider will set individualized treatment goals for you. Your goals will be based on your age, other medical conditions you have, and how you respond to diabetes treatment. Generally, the goal of treatment is to maintain the following blood glucose levels:  Before meals (preprandial): 80-130 mg/dL (1.6-1.04.4-7.2 mmol/L).  After meals (postprandial): below 180 mg/dL (10 mmol/L).  A1c level: less than 7%. Supplies needed:  Blood glucose meter.  Test strips for your meter. Each meter has its own strips. You must use the strips that came with your meter.  A needle to prick your finger (lancet). Do not use a lancet more than one time.  A device that holds the lancet (lancing device).  A journal or log book to write down your results. How to check your blood glucose  1. Wash your hands with soap and water. 2. Prick the side of your finger (not the tip) with the lancet. Use a different finger each time. 3. Gently rub the finger until a small drop of blood appears. 4. Follow instructions that come with your meter for inserting the test strip, applying  blood to the strip, and using your blood glucose meter. 5. Write down your result and any notes. Some meters allow you to use areas of your body other than your finger (alternative sites) to test your blood. The most common alternative sites are:  Forearm.  Thigh.  Palm of the hand. If you think you may have hypoglycemia, or if you have a history of not knowing when your blood glucose is getting low (hypoglycemia unawareness), do not use alternative sites. Use your finger instead. Alternative sites may not be as accurate as the fingers, because blood flow is slower in these areas. This means that the result you get may be delayed, and it may be different from the result that you would get from your finger. Follow these instructions at home: Blood glucose log   Every time you check your blood glucose, write down your result. Also write down any notes about things that may be affecting your blood glucose, such as your diet and exercise for the day. This information can help you and your health care provider: ? Look for patterns in your blood glucose over time. ? Adjust your diabetes management plan as needed.  Check if your meter allows you to download your records to a computer. Most glucose meters store a record of glucose readings in the meter. If you have type 1 diabetes:  Check your blood glucose 2 or more times a day.  Also check your blood glucose: ? Before every insulin injection. ? Before and after exercise. ? Before meals. ?  2 hours after a meal. ? Occasionally between 2:00 a.m. and 3:00 a.m., as directed. ? Before potentially dangerous tasks, like driving or using heavy machinery. ? At bedtime.  You may need to check your blood glucose more often, up to 6-10 times a day, if you: ? Use an insulin pump. ? Need multiple daily injections (MDI). ? Have diabetes that is not well-controlled. ? Are ill. ? Have a history of severe hypoglycemia. ? Have hypoglycemia  unawareness. If you have type 2 diabetes:  If you take insulin or other diabetes medicines, check your blood glucose 2 or more times a day.  If you are on intensive insulin therapy, check your blood glucose 4 or more times a day. Occasionally, you may also need to check between 2:00 a.m. and 3:00 a.m., as directed.  Also check your blood glucose: ? Before and after exercise. ? Before potentially dangerous tasks, like driving or using heavy machinery.  You may need to check your blood glucose more often if: ? Your medicine is being adjusted. ? Your diabetes is not well-controlled. ? You are ill. General tips  Always keep your supplies with you.  If you have questions or need help, all blood glucose meters have a 24-hour "hotline" phone number that you can call. You may also contact your health care provider.  After you use a few boxes of test strips, adjust (calibrate) your blood glucose meter by following instructions that came with your meter. Contact a health care provider if:  Your blood glucose is at or above 240 mg/dL (16.113.3 mmol/L) for 2 days in a row.  You have been sick or have had a fever for 2 days or longer, and you are not getting better.  You have any of the following problems for more than 6 hours: ? You cannot eat or drink. ? You have nausea or vomiting. ? You have diarrhea. Get help right away if:  Your blood glucose is lower than 54 mg/dL (3 mmol/L).  You become confused or you have trouble thinking clearly.  You have difficulty breathing.  You have moderate or large ketone levels in your urine. Summary  Monitoring your blood sugar (glucose) is an important part of managing your diabetes (diabetes mellitus).  Blood glucose monitoring involves checking your blood glucose as often as directed and keeping a record (log) of your results over time.  Your health care provider will set individualized treatment goals for you. Your goals will be based on your  age, other medical conditions you have, and how you respond to diabetes treatment.  Every time you check your blood glucose, write down your result. Also write down any notes about things that may be affecting your blood glucose, such as your diet and exercise for the day. This information is not intended to replace advice given to you by your health care provider. Make sure you discuss any questions you have with your health care provider. Document Released: 01/28/2003 Document Revised: 12/06/2016 Document Reviewed: 07/07/2015 Elsevier Interactive Patient Education  2019 [employer]  Hypertension Hypertension, commonly called high blood pressure, is when the force of blood pumping through the arteries is too strong. The arteries are the blood vessels that carry blood from the heart throughout the body. Hypertension forces the heart to work harder to pump blood and may cause arteries to become narrow or stiff. Having untreated or uncontrolled hypertension can cause heart attacks, strokes, kidney disease, and other problems. A blood pressure reading consists of a higher  number over a lower number. Ideally, your blood pressure should be below 120/80. The first ("top") number is called the systolic pressure. It is a measure of the pressure in your arteries as your heart beats. The second ("bottom") number is called the diastolic pressure. It is a measure of the pressure in your arteries as the heart relaxes. What are the causes? The cause of this condition is not known. What increases the risk? Some risk factors for high blood pressure are under your control. Others are not. Factors you can change  Smoking.  Having type 2 diabetes mellitus, high cholesterol, or both.  Not getting enough exercise or physical activity.  Being overweight.  Having too much fat, sugar, calories, or salt (sodium) in your diet.  Drinking too much alcohol. Factors that are difficult or impossible to  change  Having chronic kidney disease.  Having a family history of high blood pressure.  Age. Risk increases with age.  Race. You may be at higher risk if you are African-American.  Gender. Men are at higher risk than women before age 24. After age 74, women are at higher risk than men.  Having obstructive sleep apnea.  Stress. What are the signs or symptoms? Extremely high blood pressure (hypertensive crisis) may cause:  Headache.  Anxiety.  Shortness of breath.  Nosebleed.  Nausea and vomiting.  Severe chest pain.  Jerky movements you cannot control (seizures). How is this diagnosed? This condition is diagnosed by measuring your blood pressure while you are seated, with your arm resting on a surface. The cuff of the blood pressure monitor will be placed directly against the skin of your upper arm at the level of your heart. It should be measured at least twice using the same arm. Certain conditions can cause a difference in blood pressure between your right and left arms. Certain factors can cause blood pressure readings to be lower or higher than normal (elevated) for a short period of time:  When your blood pressure is higher when you are in a health care provider's office than when you are at home, this is called white coat hypertension. Most people with this condition do not need medicines.  When your blood pressure is higher at home than when you are in a health care provider's office, this is called masked hypertension. Most people with this condition may need medicines to control blood pressure. If you have a high blood pressure reading during one visit or you have normal blood pressure with other risk factors:  You may be asked to return on a different day to have your blood pressure checked again.  You may be asked to monitor your blood pressure at home for 1 week or longer. If you are diagnosed with hypertension, you may have other blood or imaging tests to help  your health care provider understand your overall risk for other conditions. How is this treated? This condition is treated by making healthy lifestyle changes, such as eating healthy foods, exercising more, and reducing your alcohol intake. Your health care provider may prescribe medicine if lifestyle changes are not enough to get your blood pressure under control, and if:  Your systolic blood pressure is above 130.  Your diastolic blood pressure is above 80. Your personal target blood pressure may vary depending on your medical conditions, your age, and other factors. Follow these instructions at home: Eating and drinking   Eat a diet that is high in fiber and potassium, and low in sodium, added  sugar, and fat. An example eating plan is called the DASH (Dietary Approaches to Stop Hypertension) diet. To eat this way: ? Eat plenty of fresh fruits and vegetables. Try to fill half of your plate at each meal with fruits and vegetables. ? Eat whole grains, such as whole wheat pasta, brown rice, or whole grain bread. Fill about one quarter of your plate with whole grains. ? Eat or drink low-fat dairy products, such as skim milk or low-fat yogurt. ? Avoid fatty cuts of meat, processed or cured meats, and poultry with skin. Fill about one quarter of your plate with lean proteins, such as fish, chicken without skin, beans, eggs, and tofu. ? Avoid premade and processed foods. These tend to be higher in sodium, added sugar, and fat.  Reduce your daily sodium intake. Most people with hypertension should eat less than 1,500 mg of sodium a day.  Limit alcohol intake to no more than 1 drink a day for nonpregnant women and 2 drinks a day for men. One drink equals 12 oz of beer, 5 oz of wine, or 1 oz of hard liquor. Lifestyle   Work with your health care provider to maintain a healthy body weight or to lose weight. Ask what an ideal weight is for you.  Get at least 30 minutes of exercise that causes your  heart to beat faster (aerobic exercise) most days of the week. Activities may include walking, swimming, or biking.  Include exercise to strengthen your muscles (resistance exercise), such as pilates or lifting weights, as part of your weekly exercise routine. Try to do these types of exercises for 30 minutes at least 3 days a week.  Do not use any products that contain nicotine or tobacco, such as cigarettes and e-cigarettes. If you need help quitting, ask your health care provider.  Monitor your blood pressure at home as told by your health care provider.  Keep all follow-up visits as told by your health care provider. This is important. Medicines  Take over-the-counter and prescription medicines only as told by your health care provider. Follow directions carefully. Blood pressure medicines must be taken as prescribed.  Do not skip doses of blood pressure medicine. Doing this puts you at risk for problems and can make the medicine less effective.  Ask your health care provider about side effects or reactions to medicines that you should watch for. Contact a health care provider if:  You think you are having a reaction to a medicine you are taking.  You have headaches that keep coming back (recurring).  You feel dizzy.  You have swelling in your ankles.  You have trouble with your vision. Get help right away if:  You develop a severe headache or confusion.  You have unusual weakness or numbness.  You feel faint.  You have severe pain in your chest or abdomen.  You vomit repeatedly.  You have trouble breathing. Summary  Hypertension is when the force of blood pumping through your arteries is too strong. If this condition is not controlled, it may put you at risk for serious complications.  Your personal target blood pressure may vary depending on your medical conditions, your age, and other factors. For most people, a normal blood pressure is less than  120/80.  Hypertension is treated with lifestyle changes, medicines, or a combination of both. Lifestyle changes include weight loss, eating a healthy, low-sodium diet, exercising more, and limiting alcohol. This information is not intended to replace advice given to you  by your health care provider. Make sure you discuss any questions you have with your health care provider. Document Released: 01/25/2005 Document Revised: 12/24/2015 Document Reviewed: 12/24/2015 Elsevier Interactive Patient Education  2019 [employer].

## 2018-02-20 NOTE — Progress Notes (Signed)
Established Patient Office Visit  Subjective:  Patient ID: Ricky Bright., male    DOB: [birth year]  Age: 63 y.o. MRN: [id number]  CC:  Chief Complaint  Patient presents with  . Hospitalization Follow-up    ED->Hospital 12/12-12/16: acute CVA, hypertensive urgency. Hospital 12/16-12/18: posterior circulation stroke   HPI Ricky Bright. presents for a hospital follow-up and diabetes management. Patient established care here at [facility] on 01/19/2018. When he initially presented for his visit, he was extremely hypertensive, experiencing generalized left-sided weakness, difficulty ambulating. EMS was called and transported patient to [facility] with hypertensive urgency and stroke-like symptoms. Patient was transported to [facility] and was found to have posterior circulation stroke. MRI and CTA of neck findings were as follows:  01/19/2018 MRI Impression: Multiple areas of acute/subacute infarct including the cerebellum bilaterally, right occipital lobe, right thalamus, and right caudate. Several of these show enhancement suggesting subacute duration.   01/19/2018 CTA Neck Impression: 1. Right vertebral artery occluded at the upper cervical region to foramen magnum level. This is probably a recent occlusion and explains the embolic disease within the posterior circulation branches. 2. 80% or greater stenosis of the left V4 segment. 3. Atherosclerotic disease at both carotid bifurcations but no stenosis. 4. Atherosclerotic disease in both carotid siphon regions. Stenosis in the supraclinoid internal carotid arteries estimated at 70-80% bilaterally.   He remained hospitalized from 01/23/2018-01/27/2018. He was discharged to outpatient in home rehabilitation with OT and PT. Prior to discharge cardiology placed a loop recorder inserted prior to discharge. He was notified via phone 02/13/18 of a possible AFib episodes, although he denies  experiencing any flutters, chest pain, or dizziness. He has not taken his blood pressure medication or eaten prior to appointment today. He reports improvement of weakness, activity level has returned to baseline, no dizziness and blood pressure readings at home have mostly remained less than 140/90. He is more serious regarding overall health. Suffers from type 2 diabetes. During recent hospitalization, metformin was resumed. Most recent A1C 6.3. Currently taking DAPT x 3 weeks and then will remain on only aspirin daily. He will need refills of chronic medication. Denies any problems or concerns.  Past Medical History:  Diagnosis Date  . Diabetes mellitus without complication ([place])   . HLD (hyperlipidemia)   . Hypercholesteremia   . Hypertension     Past Surgical History:  Procedure Laterality Date  . APPENDECTOMY    . bicep tendon rupture.    [name] LOOP RECORDER INSERTION N/A 01/23/2018   Procedure: LOOP RECORDER INSERTION;  Surgeon: Evans Lance, MD;  Location: [place] [facility];  Service: Cardiovascular;  Laterality: N/A;  . TEE WITHOUT CARDIOVERSION N/A 01/23/2018   Procedure: TRANSESOPHAGEAL ECHOCARDIOGRAM (TEE);  Surgeon: Larey Dresser, MD;  Location: [facility] ENDOSCOPY;  Service: Cardiovascular;  Laterality: N/A;    Family History  Problem Relation Age of Onset  . Stroke Father   . Stroke Paternal Grandfather     Social History   Socioeconomic History  . Marital status: Unknown    Spouse name: Not on file  . Number of children: Not on file  . Years of education: Not on file  . Highest education level: Not on file  Occupational History  . Not on file  Social Needs  . Financial resource strain: Not on file  . Food insecurity:    Worry: Not on file    Inability: Not on file  . Transportation needs:  Medical: Not on file    Non-medical: Not on file  Tobacco Use  . Smoking status: Never Smoker  . Smokeless tobacco: Never Used  Substance and Sexual Activity  . Alcohol  use: Not Currently  . Drug use: Never  . Sexual activity: Not on file  Lifestyle  . Physical activity:    Days per week: Not on file    Minutes per session: Not on file  . Stress: Not on file  Relationships  . Social connections:    Talks on phone: Not on file    Gets together: Not on file    Attends religious service: Not on file    Active member of club or organization: Not on file    Attends meetings of clubs or organizations: Not on file    Relationship status: Not on file  . Intimate partner violence:    Fear of current or ex partner: Not on file    Emotionally abused: Not on file    Physically abused: Not on file    Forced sexual activity: Not on file  Other Topics Concern  . Not on file  Social History Narrative  . Not on file    Outpatient Medications Prior to Visit  Medication Sig Dispense Refill  . acetaminophen (TYLENOL) 325 MG tablet Take 2 tablets (650 mg total) by mouth every 6 (six) hours as needed for mild pain.    [name] amLODipine (NORVASC) 10 MG tablet Take 1 tablet (10 mg total) by mouth daily. 30 tablet 1  . aspirin 81 MG EC tablet Take 1 tablet (81 mg total) by mouth daily. 30 tablet 1  . atorvastatin (LIPITOR) 80 MG tablet Take 1 tablet (80 mg total) by mouth daily at 6 PM. 30 tablet 0  . lisinopril (PRINIVIL,ZESTRIL) 10 MG tablet Take 1 tablet (10 mg total) by mouth 2 (two) times daily. 60 tablet 0  . clopidogrel (PLAVIX) 75 MG tablet Take 1 tablet (75 mg total) by mouth daily. (Patient not taking: Reported on 02/14/2018) 16 tablet 0  . hydroxypropyl methylcellulose / hypromellose (ISOPTO TEARS / GONIOVISC) 2.5 % ophthalmic solution Place 1 drop into both eyes daily as needed for dry eyes.    . Menthol, Topical Analgesic, (ICY HOT EX) Apply 1 application topically daily as needed (muscle pain).     No facility-administered medications prior to visit.     Allergies  Allergen Reactions  . Trazodone And Nefazodone Shortness Of Breath    ROS Review of  Systems  Pertinent negatives listed in HPI   Objective:    Physical Exam BP (!) 169/99   Pulse 72   Resp 17   Ht 6' (1.829 m)   Wt 225 lb 12.8 oz (102.4 kg)   SpO2 98%   BMI 30.62 kg/m     Physical Exam: Constitutional: Patient appears well-developed and well-nourished. No distress. HENT: Normocephalic, atraumatic, External right and left ear normal.  Eyes: Conjunctivae and EOM are normal. PERRLA, no scleral icterus. Neck: Normal ROM. Neck supple. No JVD. No tracheal deviation. No thyromegaly. CVS: RRR, S1/S2 +, no murmurs, no gallops, no carotid bruit.  Pulmonary: Effort and breath sounds normal, no stridor, rhonchi, wheezes, rales.  Abdominal: Soft. BS +, no distension, tenderness, rebound or guarding.  Musculoskeletal: Normal range of motion. No edema and no tenderness.  Neuro: Alert. Normal reflexes, muscle tone coordination. BLE and BUE strength 5/5, Hand grips symmetrical. Negative romberg. Gait intact. Skin: Skin is warm and dry. No rash noted. Not diaphoretic.  No erythema. No pallor. Psychiatric: Normal mood and affect. Behavior, judgment, thought content normal.   Wt Readings from Last 3 Encounters:  02/14/18 225 lb 12.8 oz (102.4 kg)  01/24/18 218 lb 11.1 oz (99.2 kg)  01/19/18 215 lb 6.2 oz (97.7 kg)     Health Maintenance Due  Topic Date Due  . Hepatitis C Screening  [birth year]  . FOOT EXAM  06/20/1963  . OPHTHALMOLOGY EXAM  06/20/1963  . TETANUS/TDAP  06/19/1972  . COLONOSCOPY  06/20/2003  . INFLUENZA VACCINE  09/08/2017    There are no preventive care reminders to display for this patient.  No results found for: TSH Lab Results  Component Value Date   WBC 6.6 01/24/2018   HGB 16.4 01/24/2018   HCT 49.0 01/24/2018   MCV 89.4 01/24/2018   PLT 267 01/24/2018   Lab Results  Component Value Date   NA 139 01/24/2018   K 3.7 01/24/2018   CO2 26 01/24/2018   GLUCOSE 130 (H) 01/24/2018   BUN 6 (L) 01/24/2018   CREATININE 0.95 01/24/2018   BILITOT  0.9 01/24/2018   ALKPHOS 59 01/24/2018   AST 20 01/24/2018   ALT 19 01/24/2018   PROT 7.3 01/24/2018   ALBUMIN 3.8 01/24/2018   CALCIUM 9.3 01/24/2018   ANIONGAP 11 01/24/2018   Lab Results  Component Value Date   CHOL 228 (H) 01/20/2018   Lab Results  Component Value Date   HDL 16 (L) 01/20/2018   Lab Results  Component Value Date   LDLCALC 190 (H) 01/20/2018   Lab Results  Component Value Date   TRIG 111 01/20/2018   Lab Results  Component Value Date   CHOLHDL 14.3 01/20/2018   Lab Results  Component Value Date   HGBA1C 6.3 (H) 01/20/2018      Assessment & Plan:  1. Type 2 diabetes mellitus with vascular disease ([place]) -Well controlled with recent A1C 6.3. recheck in 3 months  - Continue metformin as prescribed  2. Hx of ischemic multifocal posterior circulation stroke Completed OT and PT. No obvious deficits noted Will continue surveillance for now. Continue follow-up with physical medicine. 3. Hyperlipidemia, unspecified hyperlipidemia type Continue current statin therapy  4. Essential hypertension -Increased amlodipine 10 mg in order to gain good BP control -Continue lisinopril We have discussed target BP range and blood pressure goal. I have advised patient to check BP regularly and to call us back or report to clinic if the numbers are consistently higher than 140/90. We discussed the importance of compliance with medical therapy and DASH diet recommended, consequences of uncontrolled hypertension discussed.  - continue current BP medications   Meds ordered this encounter  Medications  . amLODipine (NORVASC) 10 MG tablet    Sig: Take 1 tablet (10 mg total) by mouth daily.    Dispense:  90 tablet    Refill:  1  . atorvastatin (LIPITOR) 80 MG tablet    Sig: Take 1 tablet (80 mg total) by mouth daily at 6 PM.    Dispense:  90 tablet    Refill:  1  . lisinopril (PRINIVIL,ZESTRIL) 10 MG tablet    Sig: Take 1 tablet (10 mg total) by mouth 2 (two) times  daily.    Dispense:  60 tablet    Refill:  2  . metFORMIN (GLUCOPHAGE) 500 MG tablet    Sig: Take 1 tablet (500 mg total) by mouth 2 (two) times daily with a meal.    Dispense:  180 tablet  Refill:  3  . blood glucose meter kit and supplies KIT    Sig: Dispense based on patient and insurance preference. Use up to four times daily as directed. (FOR ICD-10-E 11.9)    Dispense:  1 each    Refill:  0    Order Specific Question:   Number of strips    Answer:   200    Order Specific Question:   Number of lancets    Answer:   200  . aspirin 81 MG EC tablet    Sig: Take 1 tablet (81 mg total) by mouth daily.    Dispense:  30 tablet    Refill:  1  . blood glucose meter kit and supplies    Sig: Dispense based on patient and insurance preference. Use up to four times daily as directed. (FOR ICD-10 E10.9, E11.9).    Dispense:  1 each    Refill:  0    Order Specific Question:   Number of strips    Answer:   200    Order Specific Question:   Number of lancets    Answer:   200    Orders Placed This Encounter  Procedures  . Glucose (CBG)     Follow-up: 3 month follow-up of chronic conditions   Molli Barrows, [name]-C

## 2018-02-21 ENCOUNTER — Encounter: Attending: Physical Medicine & Rehabilitation

## 2018-02-21 ENCOUNTER — Encounter: Payer: Self-pay | Admitting: Physical Medicine & Rehabilitation

## 2018-02-21 ENCOUNTER — Ambulatory Visit (HOSPITAL_BASED_OUTPATIENT_CLINIC_OR_DEPARTMENT_OTHER): Admitting: Physical Medicine & Rehabilitation

## 2018-02-21 VITALS — BP 120/79 | HR 92 | Resp 14 | Ht 72.0 in | Wt 230.0 lb

## 2018-02-21 DIAGNOSIS — I69390 Apraxia following cerebral infarction: Secondary | ICD-10-CM | POA: Diagnosis not present

## 2018-02-21 DIAGNOSIS — R269 Unspecified abnormalities of gait and mobility: Secondary | ICD-10-CM | POA: Insufficient documentation

## 2018-02-21 DIAGNOSIS — I6350 Cerebral infarction due to unspecified occlusion or stenosis of unspecified cerebral artery: Secondary | ICD-10-CM

## 2018-02-21 DIAGNOSIS — I69398 Other sequelae of cerebral infarction: Secondary | ICD-10-CM | POA: Diagnosis not present

## 2018-02-21 NOTE — Progress Notes (Signed)
Subjective:    Patient ID: Ricky Bright., male    DOB: [birth year], 63 y.o.   MRN: [id number] 63 year old male with history of HTN, T2DM, hyperlipidemia, medication non-compliance for > 1 year who was admitted on 01/19/18 with reports of fall off his truck 3 weeks PTA and one week history of problems walking and dizziness. MRI brain done revealing multifocal bilateral infarcts in B-cerebellum, right occipital lobe, right thalamic and right caudate nucleus strokes. CTA head showed right vertebral artery occlusion at upper cervical region to foramen magnum level probably recent occlusion explaining embolic disease with posterior circulation and 80% or greater stenosis left V4 segment.     Stroke felt to be embolic of unclear etiology--could be related to fall with vertebral dissection versus chronic VA stenosis/occlusion versus undiagnosed A. fib.  Dr. Roda Shutters recommended DAPT x3 weeks followed by aspirin alone.  TEE done revealing mild to moderate LV hypertrophy with EF of 55 to 60% and small PFO.  Loop recorder placed to rule out A. fib.  BLE Dopplers were negative for DVT.  Patient continued to be limited by balance deficits as well as feelings of dizziness. CIR recommended for follow up therapy.     HPI Rough month , pet dog of 36yrs died No OP therapy or home health.  Walks ~46min per day, medication compliance good Seen by PCP on 1/7  Had a call from cardiologist for possible arrythmia Still feels a little off balance Doing all IADLs and basic ADLs Mod I (slowed down due to fear of falls)  Son checks up on him frequently Driving after clearance from PCP  Occ diarrhea preceded start of metformin  Pain Inventory Average Pain 0 Pain Right Now 0 My pain is no pain  In the last 24 hours, has pain interfered with the following? General activity 0 Relation with others 0 Enjoyment of life 0 What TIME of day is your pain at its worst? no pain Sleep (in general) Poor  Pain is worse  with: no pain Pain improves with: no pain Relief from Meds: no pain  Mobility walk without assistance ability to climb steps?  yes do you drive?  yes  Function not employed: date last employed .  Neuro/Psych bowel control problems  Prior Studies Any changes since last visit?  no  Physicians involved in your care Any changes since last visit?  no   Family History  Problem Relation Age of Onset  . Stroke Father   . Stroke Paternal Grandfather    Social History   Socioeconomic History  . Marital status: Unknown    Spouse name: Not on file  . Number of children: Not on file  . Years of education: Not on file  . Highest education level: Not on file  Occupational History  . Not on file  Social Needs  . Financial resource strain: Not on file  . Food insecurity:    Worry: Not on file    Inability: Not on file  . Transportation needs:    Medical: Not on file    Non-medical: Not on file  Tobacco Use  . Smoking status: Never Smoker  . Smokeless tobacco: Never Used  Substance and Sexual Activity  . Alcohol use: Not Currently  . Drug use: Never  . Sexual activity: Not on file  Lifestyle  . Physical activity:    Days per week: Not on file    Minutes per session: Not on file  . Stress: Not on  file  Relationships  . Social connections:    Talks on phone: Not on file    Gets together: Not on file    Attends religious service: Not on file    Active member of club or organization: Not on file    Attends meetings of clubs or organizations: Not on file    Relationship status: Not on file  Other Topics Concern  . Not on file  Social History Narrative  . Not on file   Past Surgical History:  Procedure Laterality Date  . APPENDECTOMY    . bicep tendon rupture.    [name]. LOOP RECORDER INSERTION N/A 01/23/2018   Procedure: LOOP RECORDER INSERTION;  Surgeon: Marinus Mawaylor, Gregg W, MD;  [facility];  Service: Cardiovascular;  Laterality: N/A;  . TEE WITHOUT  CARDIOVERSION N/A 01/23/2018   Procedure: TRANSESOPHAGEAL ECHOCARDIOGRAM (TEE);  Surgeon: Laurey MoraleMcLean, Dalton S, MD;  [facility];  Service: Cardiovascular;  Laterality: N/A;   Past Medical History:  Diagnosis Date  . Diabetes mellitus without complication ([facility])   . HLD (hyperlipidemia)   . Hypercholesteremia   . Hypertension    There were no vitals taken for this visit.  Opioid Risk Score:   Fall Risk Score:  `1  Depression screen PHQ 2/9  Depression screen PHQ 2/9 01/20/2018  Decreased Interest 2  Down, Depressed, Hopeless 2  PHQ - 2 Score 4  Altered sleeping 2  Tired, decreased energy 2  Change in appetite 2  Feeling bad or failure about yourself  2  Trouble concentrating 2  Moving slowly or fidgety/restless 2  Suicidal thoughts 2  PHQ-9 Score 18  -prmh  Review of Systems  Constitutional: Negative.   HENT: Negative.   Eyes: Negative.   Respiratory: Negative.   Cardiovascular: Negative.   Gastrointestinal: Positive for diarrhea.  Endocrine:       High blood sugar  Genitourinary: Negative.   Musculoskeletal: Negative.   Skin: Negative.   Allergic/Immunologic: Negative.   Psychiatric/Behavioral: Negative.   All other systems reviewed and are negative.      Objective:   Physical Exam Vitals signs reviewed.  Constitutional:      Appearance: Normal appearance.  HENT:     Head: Normocephalic.     Nose: Nose normal.     Mouth/Throat:     Mouth: Mucous membranes are moist.  Eyes:     General: No visual field deficit or scleral icterus.    Conjunctiva/sclera: Conjunctivae normal.  Neck:     Musculoskeletal: Normal range of motion. No muscular tenderness.  Cardiovascular:     Rate and Rhythm: Normal rate and regular rhythm.     Heart sounds: Normal heart sounds. No murmur.  Pulmonary:     Effort: Pulmonary effort is normal. No respiratory distress.     Breath sounds: Normal breath sounds. No stridor. No wheezing or rhonchi.  Abdominal:     General:  Abdomen is flat. Bowel sounds are normal. There is no distension.     Palpations: Abdomen is soft. There is no mass.     Tenderness: There is no abdominal tenderness.  Musculoskeletal: Normal range of motion.        General: No tenderness.     Right lower leg: No edema.     Left lower leg: No edema.  Skin:    General: Skin is warm and dry.  Neurological:     Mental Status: He is alert and oriented to person, place, and time.  Cranial Nerves: Cranial nerves are intact. No dysarthria.     Sensory: Sensation is intact.     Motor: No weakness, tremor or atrophy.     Coordination: Bright sign negative. Coordination normal. Rapid alternating movements normal.     Gait: Gait and tandem walk normal.     Comments: Sharpened Bright positive.  Motor strength is 5/5 bilateral deltoid, bicep, tricep, grip, hip flexor, knee extensor, ankle dorsiflexor and plantar flexor.  Psychiatric:        Mood and Affect: Mood normal.        Behavior: Behavior normal.        Thought Content: Thought content normal.        Judgment: Judgment normal.           Assessment & Plan:  1.  History of vertebral artery occlusion with multiple by cerebral and cerebellar infarcts, mainly posterior circulation but also in the caudate. Was evaluated by neurology in the hospital placed on dual antiplatelet agent.  Loop recorder placed.  Did have right vertebral artery occlusion.  Patient gives history of fall preceding CVA. Will have patient follow-up with neurology and continue aspirin Fortunately his neurologic deficits have largely resolved.  He still has some higher-level balance disorder but otherwise is back to his usual routine at home. Do not think he needs any further formalized therapy He will continue his walking on a daily basis.  I will see him back on a as needed basis Made referral to neurology Patient will follow-up with primary care as well

## 2018-02-21 NOTE — Patient Instructions (Signed)
Follow up with neurology and see if you need another scan in the future

## 2018-02-27 ENCOUNTER — Ambulatory Visit (INDEPENDENT_AMBULATORY_CARE_PROVIDER_SITE_OTHER)

## 2018-02-27 DIAGNOSIS — I639 Cerebral infarction, unspecified: Secondary | ICD-10-CM

## 2018-02-28 LAB — CUP PACEART REMOTE DEVICE CHECK
Date Time Interrogation Session: 20200118203750
Implantable Pulse Generator Implant Date: 20191216

## 2018-02-28 NOTE — Progress Notes (Signed)
[facility] Summary Report / Loop Recorder 

## 2018-03-03 ENCOUNTER — Telehealth: Payer: Self-pay | Admitting: Cardiology

## 2018-03-03 NOTE — Telephone Encounter (Addendum)
Transmission received and reviewed with Dr. Ladona Ridgel. "AF" episode ECGs from 1/22 and 1/23 show NSR with PACs per Dr. Ladona Ridgel. Will continue to monitor remotely for true AF.

## 2018-03-03 NOTE — Telephone Encounter (Signed)
LMOVM for pt to send a manual transmission w/ his home monitor.  

## 2018-03-03 NOTE — Telephone Encounter (Signed)
Spoke w/ pt and instructed him how to send a manual transmission /w his home monitor.  

## 2018-03-06 ENCOUNTER — Other Ambulatory Visit: Payer: Self-pay | Admitting: Internal Medicine

## 2018-03-08 ENCOUNTER — Telehealth: Payer: Self-pay

## 2018-03-08 NOTE — Telephone Encounter (Signed)
Left a message on pt voicemail to send a manual home remote transmission. If he has any questions to call [phone].

## 2018-03-09 NOTE — Telephone Encounter (Signed)
Manual transmission received and reviewed. 3 "AF" episodes appear false, ECGs show SR w/PVCs and PACs. ECGs printed and placed in Dr. Valarie Cones folder for review.

## 2018-03-10 ENCOUNTER — Telehealth: Payer: Self-pay

## 2018-03-10 NOTE — Telephone Encounter (Signed)
I left a voicemail for the pt to send a manual transmission with his home monitor. I gave the pt my direct number if he has any questions.

## 2018-03-10 NOTE — Telephone Encounter (Signed)
I talked to the patient and let him know that the Nurse Practitioner looked at his transmission and everything looks normal.

## 2018-03-20 ENCOUNTER — Telehealth: Payer: Self-pay

## 2018-03-20 NOTE — Telephone Encounter (Signed)
I left a message on pt answering machine to give me a call back on my direct number.

## 2018-03-21 NOTE — Telephone Encounter (Signed)
Manual transmission received and reviewed. Available "AF" episodes all appear SR w/artifact and ectopy, not true AF. ECGs printed and placed in Dr. Valarie Conesaylor's LINQ folder for review.

## 2018-03-30 ENCOUNTER — Ambulatory Visit (INDEPENDENT_AMBULATORY_CARE_PROVIDER_SITE_OTHER)

## 2018-03-30 DIAGNOSIS — I639 Cerebral infarction, unspecified: Secondary | ICD-10-CM

## 2018-04-02 LAB — CUP PACEART REMOTE DEVICE CHECK
Implantable Pulse Generator Implant Date: 20191216
MDC IDC SESS DTM: 20200220211147

## 2018-04-06 NOTE — Progress Notes (Signed)
[facility] Summary Report / Loop Recorder 

## 2018-04-17 ENCOUNTER — Encounter: Payer: Self-pay | Admitting: Family Medicine

## 2018-04-17 ENCOUNTER — Ambulatory Visit (INDEPENDENT_AMBULATORY_CARE_PROVIDER_SITE_OTHER): Admitting: Family Medicine

## 2018-04-17 VITALS — BP 146/88 | HR 76 | Temp 98.1°F | Resp 17 | Ht 72.0 in | Wt 239.0 lb

## 2018-04-17 DIAGNOSIS — E785 Hyperlipidemia, unspecified: Secondary | ICD-10-CM | POA: Diagnosis not present

## 2018-04-17 DIAGNOSIS — E1140 Type 2 diabetes mellitus with diabetic neuropathy, unspecified: Secondary | ICD-10-CM

## 2018-04-17 DIAGNOSIS — I10 Essential (primary) hypertension: Secondary | ICD-10-CM

## 2018-04-17 DIAGNOSIS — G629 Polyneuropathy, unspecified: Secondary | ICD-10-CM

## 2018-04-17 DIAGNOSIS — E1159 Type 2 diabetes mellitus with other circulatory complications: Secondary | ICD-10-CM

## 2018-04-17 LAB — GLUCOSE, POCT (MANUAL RESULT ENTRY): POC Glucose: 124 mg/dl — AB (ref 70–99)

## 2018-04-17 MED ORDER — GABAPENTIN 300 MG PO CAPS: 600.0000 mg | ORAL_CAPSULE | Freq: Every day | ORAL | 3 refills | Status: AC

## 2018-04-17 NOTE — Progress Notes (Signed)
Patient ID: Ricky Bright., male    DOB: [birth year], 63 y.o.   MRN: [id number]  PCP: Scot Jun, [name]  Chief Complaint  Patient presents with  . Diabetes  . Hypertension  . Hyperlipidemia    Subjective:  HPI Ricky Bright. is a 63 y.o. male presents for chronic condition management.  Diabetes Patient is routinely checking blood sugar at home.  Denies any hypo-or hyperglycemia.  Most readings at home have been consistently below 150. He reports adherence to metformin. He endorses some neuropathic pain of the feet bilaterally and reports that pain is worse at nighttime. He characterizes the foot pain as pins-and-needles. He has not tried any medications in the past for neuropathy.  Hypertension/ Hyperlipidemia  Reports routine monitoring of blood pressure at home.  Reports majority of her readings are less than 120/80.  His blood pressure is 146/88 today. He took medication approximately 1 hour prior to arrival today.  He is fasting although has had a cup of coffee. He denies any dizziness, blurring of vision, impaired gait, palpitations, chest pain, shortness of breath or swelling. Social History   Socioeconomic History  . Marital status: Unknown    Spouse name: Not on file  . Number of children: Not on file  . Years of education: Not on file  . Highest education level: Not on file  Occupational History  . Not on file  Social Needs  . Financial resource strain: Not on file  . Food insecurity:    Worry: Not on file    Inability: Not on file  . Transportation needs:    Medical: Not on file    Non-medical: Not on file  Tobacco Use  . Smoking status: Never Smoker  . Smokeless tobacco: Never Used  Substance and Sexual Activity  . Alcohol use: Not Currently  . Drug use: Never  . Sexual activity: Not on file  Lifestyle  . Physical activity:    Days per week: Not on file    Minutes per session: Not on file  . Stress: Not on file  Relationships  . Social  connections:    Talks on phone: Not on file    Gets together: Not on file    Attends religious service: Not on file    Active member of club or organization: Not on file    Attends meetings of clubs or organizations: Not on file    Relationship status: Not on file  . Intimate partner violence:    Fear of current or ex partner: Not on file    Emotionally abused: Not on file    Physically abused: Not on file    Forced sexual activity: Not on file  Other Topics Concern  . Not on file  Social History Narrative  . Not on file    Family History  Problem Relation Age of Onset  . Stroke Father   . Stroke Paternal Grandfather    Review of Systems Pertinent negatives listed in HPI  Patient Active Problem List   Diagnosis Date Noted  . 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 Dyslipidemia   . Essential hypertension   . Constipation, slow transit   . Acute CVA (cerebrovascular accident) ([place]) 01/19/2018  . Hypertensive urgency 01/19/2018  . Type 2 diabetes mellitus with vascular disease ([place]) 01/19/2018  . Hyperlipidemia 01/19/2018  . CVA (cerebral vascular accident) ([place]) 01/19/2018    Allergies  Allergen Reactions  . Trazodone And Nefazodone Shortness Of Breath  Prior to Admission medications   Medication Sig Start Date End Date Taking? Authorizing Provider  amLODipine (NORVASC) 10 MG tablet Take 1 tablet (10 mg total) by mouth daily. 02/14/18  Yes Scot Jun, [name]  aspirin 81 MG EC tablet Take 1 tablet (81 mg total) by mouth daily. 02/14/18  Yes Scot Jun, [name]  atorvastatin (LIPITOR) 80 MG tablet Take 1 tablet (80 mg total) by mouth daily at 6 PM. 02/14/18  Yes Scot Jun, [name]  blood glucose meter kit and supplies Dispense based on patient and insurance preference. Use up to four times daily as directed. (FOR ICD-10 E10.9, E11.9). 02/14/18  Yes Scot Jun, [name]  lisinopril (PRINIVIL,ZESTRIL) 10 MG tablet Take 1 tablet (10 mg total) by mouth 2  (two) times daily. 02/14/18  Yes Scot Jun, [name]  metFORMIN (GLUCOPHAGE) 500 MG tablet Take 1 tablet (500 mg total) by mouth 2 (two) times daily with a meal. 02/14/18  Yes Scot Jun, [name]    Past Medical, Surgical Family and Social History reviewed and updated.    Objective:   Today's Vitals   04/17/18 1007  BP: (!) 146/88  Pulse: 76  Resp: 17  Temp: 98.1 F (36.7 C)  TempSrc: Oral  SpO2: 97%  Weight: 239 lb (108.4 kg)  Height: 6' (1.829 m)    Wt Readings from Last 3 Encounters:  04/17/18 239 lb (108.4 kg)  02/21/18 230 lb (104.3 kg)  02/14/18 225 lb 12.8 oz (102.4 kg)     Physical Exam General appearance: alert, well developed, well nourished, cooperative and in no distress Head: Normocephalic, without obvious abnormality, atraumatic Respiratory: Respirations even and unlabored, normal respiratory rate Heart: rate and rhythm normal. No gallop or murmurs noted on exam  Abdomen: BS +, no distention, no rebound tenderness, or no mass Extremities: No gross deformities Skin: Skin color, texture, turgor normal. No rashes seen  Psych: Appropriate mood and affect. Neurologic: Mental status: Alert, oriented to person, place, and time, thought content appropriate. Lab Results  Component Value Date   POCGLU 124 (A) 04/17/2018   POCGLU 125 (A) 02/14/2018   POCGLU >600 01/19/2018    Lab Results  Component Value Date   HGBA1C 6.3 (H) 01/20/2018     Assessment & Plan:  1. Type 2 diabetes mellitus with vascular disease ([place]) Home readings reassuring <150. Continue Metformin  Checking the following:  - Glucose (CBG) - Hemoglobin A1c - HM Diabetes Foot Exam  2. Essential hypertension, elevated today.  Prior reading and home readings at goal of <130/90 No medication changes today Checking  - Basic metabolic panel - TSH  3. Hyperlipidemia, unspecified hyperlipidemia type - Lipid panel, fasting   4. Neuropathy -Start Gabapentin 600 mg at bedtime as needed  for foot pain    -The patient was given clear instructions to go to ER or return to medical center if symptoms do not improve, worsen or new problems develop. The patient verbalized understanding.    Molli Barrows, [name] Primary Care at [facility] [address] [place] [phone]: [phone]

## 2018-04-17 NOTE — Patient Instructions (Signed)
Diabetic Neuropathy-I have prescribed you Gabapentin 600 mg at bedtime for foot pain Diabetic neuropathy refers to nerve damage that is caused by diabetes (diabetes mellitus). Over time, people with diabetes can develop nerve damage throughout the body. There are several types of diabetic neuropathy:  Peripheral neuropathy. This is the most common type of diabetic neuropathy. It causes damage to nerves that carry signals between the spinal cord and other parts of the body (peripheral nerves). This usually affects nerves in the feet and legs first, and may eventually affect the hands and arms. The damage affects the ability to sense touch or temperature.  Autonomic neuropathy. This type causes damage to nerves that control involuntary functions (autonomic nerves). These nerves carry signals that control: ? Heartbeat. ? Body temperature. ? Blood pressure. ? Urination. ? Digestion. ? Sweating. ? Sexual function. ? Response to changing blood sugar (glucose) levels.  Focal neuropathy. This type of nerve damage affects one area of the body, such as an arm, a leg, or the face. The injury may involve one nerve or a small group of nerves. Focal neuropathy can be painful and unpredictable, and occurs most often in older adults with diabetes. This often develops suddenly, but usually improves over time and does not cause long-term problems.  Proximal neuropathy. This type of nerve damage affects the nerves of the thighs, hips, buttocks, or legs. It causes severe pain, weakness, and muscle death (atrophy), usually in the thigh muscles. It is more common among older men and people who have type 2 diabetes. The length of recovery time may vary. What are the causes? Peripheral, autonomic, and focal neuropathies are caused by diabetes that is not well controlled with treatment. The cause of proximal neuropathy is not known, but it may be caused by inflammation related to uncontrolled blood glucose  levels. What are the signs or symptoms? Peripheral neuropathy Peripheral neuropathy develops slowly over time. When the nerves of the feet and legs no longer work, you may experience:  Burning, stabbing, or aching pain in the legs or feet.  Pain or cramping in the legs or feet.  Loss of feeling (numbness) and inability to feel pressure or pain in the feet. This can lead to: ? Thick calluses or sores on areas of constant pressure. ? Ulcers. ? Reduced ability to feel temperature changes.  Foot deformities.  Muscle weakness.  Loss of balance or coordination. Autonomic neuropathy The symptoms of autonomic neuropathy vary depending on which nerves are affected. Symptoms may include:  Problems with digestion, such as: ? Nausea or vomiting. ? Poor appetite. ? Bloating. ? Diarrhea or constipation. ? Trouble swallowing. ? Losing weight without trying to.  Problems with the heart, blood and lungs, such as: ? Dizziness, especially when standing up. ? Fainting. ? Shortness of breath. ? Irregular heartbeat.  Bladder problems, such as: ? Trouble starting or stopping urination. ? Leaking urine. ? Trouble emptying the bladder. ? Urinary tract infections (UTIs).  Problems with other body functions, such as: ? Sweat. You may sweat too much or too little. ? Temperature. You might get hot easily. Or, you might feel cold more than usual. ? Sexual function. Men may not be able to get or maintain an erection. Women may have vaginal dryness and difficulty with arousal. Focal neuropathy Symptoms affect only one area of the body. Common symptoms include:  Numbness.  Tingling.  Burning pain.  Prickling feeling.  Very sensitive skin.  Weakness.  Inability to move (paralysis).  Muscle twitching.  Muscles  getting smaller (wasting).  Poor coordination.  Double or blurred vision. Proximal neuropathy  Sudden, severe pain in the hip, thigh, or buttocks. Pain may spread from the  back into the legs (sciatica).  Pain and numbness in the arms and legs.  Tingling.  Loss of bladder control or bowel control.  Weakness and wasting of thigh muscles.  Difficulty getting up from a seated position.  Abdominal swelling.  Unexplained weight loss. How is this diagnosed? Diagnosis usually involves reviewing your medical history and any symptoms you have. Diagnosis varies depending on the type of neuropathy your health care provider suspects. Peripheral neuropathy Your health care provider will check areas that are affected by your nervous system (neurologic exam), such as your reflexes, how you move, and what you can feel. You may have other tests, such as:  Blood tests.  Removal and examination of fluid that surrounds the spinal cord (lumbar puncture).  CT scan.  MRI.  A test to check the nerves that control muscles (electromyogram, EMG).  Tests of how quickly messages pass through your nerves (nerve conduction velocity tests).  Removal of a small piece of nerve to be examined under a microscope (biopsy). Autonomic neuropathy You may have tests, such as:  Tests to measure your blood pressure and heart rate. This may include monitoring you while you are safely secured to an exam table that moves you from a lying position to an upright position (table tilt test).  Breathing tests to check your lungs.  Tests to check how food moves through the digestive system (gastric emptying tests).  Blood, sweat, or urine tests.  Ultrasound of your bladder.  Spinal fluid tests. Focal neuropathy This condition may be diagnosed with:  A neurologic exam.  CT scan.  MRI.  EMG.  Nerve conduction velocity tests. Proximal neuropathy There is no test to diagnose this type of neuropathy. You may have tests to rule out other possible causes of this type of neuropathy. Tests may include:  X-rays of your spine and lumbar region.  Lumbar puncture.  MRI. How is this  treated? The goal of treatment is to keep nerve damage from getting worse. The most important part of treatment is keeping your blood glucose level and your A1C level within your target range by following your diabetes management plan. Over time, maintaining lower blood glucose levels helps lessen symptoms. In some cases, you may need prescription pain medicine. Follow these instructions at home:  Lifestyle   Do not use any products that contain nicotine or tobacco, such as cigarettes and e-cigarettes. If you need help quitting, ask your health care provider.  Be physically active every day. Include strength training and balance exercises.  Follow a healthy meal plan.  Work with your health care provider to manage your blood pressure. General instructions  Follow your diabetes management plan as directed. ? Check your blood glucose levels as directed by your health care provider. ? Keep your blood glucose in your target range as directed by your health care provider. ? Have your A1C level checked at least two times a year, or as often as told by your health care provider.  Take over the counter and prescription medicines only as told by your health care provider. This includes insulin and diabetes medicine.  Do not drive or use heavy machinery while taking prescription pain medicines.  Check your skin and feet every day for cuts, bruises, redness, blisters, or sores.  Keep all follow up visits as told by your health  care provider. This is important. Contact a health care provider if:  You have burning, stabbing, or aching pain in your legs or feet.  You are unable to feel pressure or pain in your feet.  You develop problems with digestion, such as: ? Nausea. ? Vomiting. ? Bloating. ? Constipation. ? Diarrhea. ? Abdominal pain.  You have difficulty with urination, such as inability: ? To control when you urinate (incontinence). ? To completely empty the bladder  (retention).  You have palpitations.  You feel dizzy, weak, or faint when you stand up. Get help right away if:  You cannot urinate.  You have sudden weakness or loss of coordination.  You have trouble speaking.  You have pain or pressure in your chest.  You have an irregular heart beat.  You have sudden inability to move a part of your body. Summary  Diabetic neuropathy refers to nerve damage that is caused by diabetes. It can affect nerves throughout the entire body, causing numbness and pain in the arms, legs, digestive tract, heart, and other body systems.  Keep your blood glucose level and your blood pressure in your target range, as directed by your health care provider. This can help prevent neuropathy from getting worse.  Check your skin and feet every day for cuts, bruises, redness, blisters, or sores.  Do not use any products that contain nicotine or tobacco, such as cigarettes and e-cigarettes. If you need help quitting, ask your health care provider. This information is not intended to replace advice given to you by your health care provider. Make sure you discuss any questions you have with your health care provider. Document Released: 04/05/2001 Document Revised: 03/09/2017 Document Reviewed: 03/01/2016 Elsevier Interactive Patient Education  2019 [employer].

## 2018-04-18 ENCOUNTER — Ambulatory Visit (INDEPENDENT_AMBULATORY_CARE_PROVIDER_SITE_OTHER): Admitting: Nurse Practitioner

## 2018-04-18 DIAGNOSIS — I639 Cerebral infarction, unspecified: Secondary | ICD-10-CM

## 2018-04-18 LAB — BASIC METABOLIC PANEL
BUN/Creatinine Ratio: 16 (ref 10–24)
BUN: 15 mg/dL (ref 8–27)
CHLORIDE: 100 mmol/L (ref 96–106)
CO2: 22 mmol/L (ref 20–29)
Calcium: 9.4 mg/dL (ref 8.6–10.2)
Creatinine, Ser: 0.91 mg/dL (ref 0.76–1.27)
GFR calc Af Amer: 103 mL/min/{1.73_m2} (ref >59)
GFR calc non Af Amer: 89 mL/min/{1.73_m2} (ref >59)
GLUCOSE: 124 mg/dL — AB (ref 65–99)
Potassium: 3.9 mmol/L (ref 3.5–5.2)
Sodium: 141 mmol/L (ref 134–144)

## 2018-04-18 LAB — LIPID PANEL
Chol/HDL Ratio: 4.6 ratio (ref 0.0–5.0)
Cholesterol, Total: 170 mg/dL (ref 100–199)
HDL: 37 mg/dL — ABNORMAL LOW (ref >39)
LDL Calculated: 93 mg/dL (ref 0–99)
Triglycerides: 199 mg/dL — ABNORMAL HIGH (ref 0–149)
VLDL Cholesterol Cal: 40 mg/dL (ref 5–40)

## 2018-04-18 LAB — CUP PACEART INCLINIC DEVICE CHECK
Date Time Interrogation Session: 20200310083849
Implantable Pulse Generator Implant Date: 20191216

## 2018-04-18 LAB — HEMOGLOBIN A1C
ESTIMATED AVERAGE GLUCOSE: 131 mg/dL
Hgb A1c MFr Bld: 6.2 % — ABNORMAL HIGH (ref 4.8–5.6)

## 2018-04-18 LAB — TSH: TSH: 2.19 u[IU]/mL (ref 0.450–4.500)

## 2018-04-18 NOTE — Progress Notes (Signed)
LINQ check in clinic. Device reprogrammed to less sensitive, AF storage to 6 minutes to try and limit false AF episodes seen. Pt advised to send manual transmission this afternoon to update programming changes in [facility].

## 2018-04-20 ENCOUNTER — Telehealth: Payer: Self-pay | Admitting: Family Medicine

## 2018-04-20 NOTE — Telephone Encounter (Signed)
Patient called requesting lab results, please follow up °

## 2018-04-20 NOTE — Progress Notes (Signed)
Patient notified of results/recommendations.

## 2018-04-25 ENCOUNTER — Other Ambulatory Visit: Payer: Self-pay | Admitting: Internal Medicine

## 2018-05-02 ENCOUNTER — Other Ambulatory Visit: Payer: Self-pay

## 2018-05-02 ENCOUNTER — Ambulatory Visit (INDEPENDENT_AMBULATORY_CARE_PROVIDER_SITE_OTHER): Admitting: *Deleted

## 2018-05-02 DIAGNOSIS — I639 Cerebral infarction, unspecified: Secondary | ICD-10-CM | POA: Diagnosis not present

## 2018-05-03 LAB — CUP PACEART REMOTE DEVICE CHECK
Date Time Interrogation Session: 20200324214213
Implantable Pulse Generator Implant Date: 20191216

## 2018-05-08 NOTE — Progress Notes (Signed)
[facility] Summary Report / Loop Recorder 

## 2018-05-29 ENCOUNTER — Other Ambulatory Visit: Payer: Self-pay | Admitting: Family Medicine

## 2018-06-05 ENCOUNTER — Other Ambulatory Visit: Payer: Self-pay

## 2018-06-05 ENCOUNTER — Encounter: Admitting: *Deleted

## 2018-06-05 LAB — CUP PACEART REMOTE DEVICE CHECK
Date Time Interrogation Session: 20200426221206
Implantable Pulse Generator Implant Date: 20191216

## 2018-07-07 ENCOUNTER — Ambulatory Visit (INDEPENDENT_AMBULATORY_CARE_PROVIDER_SITE_OTHER): Payer: Medicare Other | Admitting: *Deleted

## 2018-07-07 DIAGNOSIS — I639 Cerebral infarction, unspecified: Secondary | ICD-10-CM

## 2018-07-08 LAB — CUP PACEART REMOTE DEVICE CHECK
Date Time Interrogation Session: 20200529223738
Implantable Pulse Generator Implant Date: 20191216

## 2018-07-12 NOTE — Progress Notes (Signed)
[facility] Summary Report / Loop Recorder 

## 2018-07-18 ENCOUNTER — Encounter: Payer: Self-pay | Admitting: Family Medicine

## 2018-07-18 ENCOUNTER — Other Ambulatory Visit: Payer: Self-pay

## 2018-07-18 ENCOUNTER — Ambulatory Visit (INDEPENDENT_AMBULATORY_CARE_PROVIDER_SITE_OTHER): Payer: Medicare Other | Admitting: Family Medicine

## 2018-07-18 VITALS — BP 154/90 | HR 88 | Temp 98.0°F | Ht 72.0 in | Wt 243.6 lb

## 2018-07-18 DIAGNOSIS — G4700 Insomnia, unspecified: Secondary | ICD-10-CM

## 2018-07-18 DIAGNOSIS — E1159 Type 2 diabetes mellitus with other circulatory complications: Secondary | ICD-10-CM

## 2018-07-18 DIAGNOSIS — E781 Pure hyperglyceridemia: Secondary | ICD-10-CM | POA: Diagnosis not present

## 2018-07-18 DIAGNOSIS — Z1159 Encounter for screening for other viral diseases: Secondary | ICD-10-CM | POA: Diagnosis not present

## 2018-07-18 DIAGNOSIS — I6350 Cerebral infarction due to unspecified occlusion or stenosis of unspecified cerebral artery: Secondary | ICD-10-CM

## 2018-07-18 DIAGNOSIS — Z8673 Personal history of transient ischemic attack (TIA), and cerebral infarction without residual deficits: Secondary | ICD-10-CM

## 2018-07-18 DIAGNOSIS — I10 Essential (primary) hypertension: Secondary | ICD-10-CM

## 2018-07-18 DIAGNOSIS — Z23 Encounter for immunization: Secondary | ICD-10-CM | POA: Diagnosis not present

## 2018-07-18 DIAGNOSIS — Z1331 Encounter for screening for depression: Secondary | ICD-10-CM

## 2018-07-18 MED ORDER — DOXEPIN HCL 25 MG PO CAPS: 25.0000 mg | ORAL_CAPSULE | Freq: Every evening | ORAL | 1 refills | Status: DC | PRN

## 2018-07-18 NOTE — Progress Notes (Signed)
Patient ID: Ricky Speck., male    DOB: 1955/11/22, 63 y.o.   MRN: [id number]  PCP: Ricky Jun, [name]  No chief complaint on file.   Subjective:  HPI Ricky Bright. is a 63 y.o. male presents for evaluation hypertension, hypertriglyceridemia, and diabetes.  Ricky Bright has Acute CVA (cerebrovascular accident) ([place]); Hypertensive urgency; Type 2 diabetes mellitus with vascular disease ([place]); Hyperlipidemia; CVA (cerebral vascular accident) ([place]); Posterior circulation stroke ([place]); Dyslipidemia; Essential hypertension; and Constipation, slow transit on their problem list.  Diabetes Recent A1C 6.2. Monitors glucose at home. Home readings average  Physically active with ADL's although no routine physical activity. Current Body mass index is 33.04 kg/m. Diabetic neuropathy controlled with Gabapentin. Denies 3 P's.  Hypertension S/P CVA 00/93/8182 Embolic stroke. History of poor blood pressure control secondary to medication non-adherence. Monitors BP at home and reports most readings within 120/80. He reports mostly taking BP readings in the supine position. BP well controlled since recent hospitalization with lisinopril and amlodipine. Denies chest pain, shortness of breath, dizziness, or new weakness.   Insomnia History of anxiety and history of depression causing insomnia. Treated at the [facility] in the past with Prozac and Trazodone. Suffered a reaction of syncope and shortness of breath with trazodone. Prozac made him feel very strange and caused weird thoughts. Prozac was discontinued. Reports troubling and racing thoughts mostly occur during the nighttime. He is able to fall asleep but reports unable to stay asleep. Most of depression comes from thinking of both parents that are deceased. 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 Denies suicidal ideations or plan, homicidal ideations, or auditory hallucinations. Wishes to achieve sleep to increase activities  during the day. Complains of severe fatigue during the day due to lack of sleep. Social History   Socioeconomic History  . Marital status: Unknown    Spouse name: Not on file  . Number of children: Not on file  . Years of education: Not on file  . Highest education level: Not on file  Occupational History  . Not on file  Social Needs  . Financial resource strain: Not on file  . Food insecurity:    Worry: Not on file    Inability: Not on file  . Transportation needs:    Medical: Not on file    Non-medical: Not on file  Tobacco Use  . Smoking status: Never Smoker  . Smokeless tobacco: Never Used  Substance and Sexual Activity  . Alcohol use: Not Currently  . Drug use: Never  . Sexual activity: Not on file  Lifestyle  . Physical activity:    Days per week: Not on file    Minutes per session: Not on file  . Stress: Not on file  Relationships  . Social connections:    Talks on phone: Not on file    Gets together: Not on file    Attends religious service: Not on file    Active member of club or organization: Not on file    Attends meetings of clubs or organizations: Not on file    Relationship status: Not on file  . Intimate partner violence:    Fear of current or ex partner: Not on file    Emotionally abused: Not on file    Physically abused: Not on file    Forced sexual activity: Not on file  Other Topics Concern  . Not on file  Social History Narrative  . Not on file  Family History  Problem Relation Age of Onset  . Stroke Father   . Stroke Paternal Grandfather    Review of Systems Pertinent negatives listed in HPI Patient Active Problem List   Diagnosis Date Noted  . 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 Dyslipidemia   . Essential hypertension   . Constipation, slow transit   . Acute CVA (cerebrovascular accident) ([place]) 01/19/2018  . Hypertensive urgency 01/19/2018  . Type 2 diabetes mellitus with vascular disease ([place]) 01/19/2018  .  Hyperlipidemia 01/19/2018  . CVA (cerebral vascular accident) ([place]) 01/19/2018    Allergies  Allergen Reactions  . Trazodone And Nefazodone Shortness Of Breath    Prior to Admission medications   Medication Sig Start Date End Date Taking? Authorizing Provider  amLODipine (NORVASC) 10 MG tablet Take 1 tablet (10 mg total) by mouth daily. 02/14/18   Ricky Jun, [name]  aspirin 81 MG EC tablet Take 1 tablet (81 mg total) by mouth daily. 02/14/18   Ricky Jun, [name]  atorvastatin (LIPITOR) 80 MG tablet Take 1 tablet (80 mg total) by mouth daily at 6 PM. 02/14/18   Ricky Jun, [name]  blood glucose meter kit and supplies Dispense based on patient and insurance preference. Use up to four times daily as directed. (FOR ICD-10 E10.9, E11.9). 02/14/18   Ricky Jun, [name]  gabapentin (NEURONTIN) 300 MG capsule Take 2 capsules (600 mg total) by mouth at bedtime. 04/17/18   Ricky Jun, [name]  lisinopril (ZESTRIL) 10 MG tablet TAKE 1 TABLET BY MOUTH TWICE DAILY 05/29/18   Ricky Jun, [name]  metFORMIN (GLUCOPHAGE) 500 MG tablet Take 1 tablet (500 mg total) by mouth 2 (two) times daily with a meal. 02/14/18   Ricky Jun, [name]    Past Medical, Surgical Family and Social History reviewed and updated.    Objective:  There were no vitals filed for this visit.  Wt Readings from Last 3 Encounters:  04/17/18 239 lb (108.4 kg)  02/21/18 230 lb (104.3 kg)  02/14/18 225 lb 12.8 oz (102.4 kg)     Physical Exam General appearance: alert, well developed, well nourished, cooperative and in no distress Head: Normocephalic, without obvious abnormality, atraumatic Respiratory: Respirations even and unlabored, normal respiratory rate Heart: rate and rhythm normal. No gallop or murmurs noted on exam  Abdomen: BS +, no distention, no rebound tenderness, or no mass Extremities: No gross deformities Skin: Skin color, texture, turgor normal. No rashes seen  Psych: Appropriate mood and  affect. Neurologic: A&O x 3. Gait intact. BUE and BLE 5/5  Mental status: Alert, oriented to person, place, and time, thought content appropriate.  Lab Results  Component Value Date   POCGLU 124 (A) 04/17/2018   POCGLU 125 (A) 02/14/2018   POCGLU >600 01/19/2018    Lab Results  Component Value Date   HGBA1C 6.2 (H) 04/17/2018     Assessment & Plan:  1. Type 2 diabetes mellitus with vascular disease ([place]), well controlled  Home readings consistently less than 150 indicating well-controlled.  Will obtain a repeat A1c today.  No changes to medications today.  Encourage routine physical activity as tolerated. Prevnar 13 administered today   2. Essential hypertension, elevated today.  Patient reports home readings are around 120/80 however he endorses taking blood pressure mostly lying down.  Encourage patient to check blood pressure sitting up as readings are likely higher than sitting upright position.  Advised patient to notify office if blood pressures are consistently greater  than 140/90.  If blood pressure remains elevated will titrate lisinopril.  Repeat blood pressure today in office is 150/94.  3. History of CVA (cerebrovascular accident), no deficits. Loop recorder in place and monitored by cardiology  4. Hypertriglyceridemia -Repeat fasting lipid panel   5. Encounter for hepatitis C screening test for low risk patient - Hepatitis c vrs RNA detect by PCR-qual  6. Insomnia, unspecified type -Given mixed depression and anxiety in addition to insomnia will treat with doxepin 25 mg nightly as needed.  Advised patient he can take nightly as medication may provide some benefit to his depression and anxiety symptoms which are likely contributing to his inability to sleep.  7. Positive depression screening Hx of depression and anxiety.  Given history of adverse reactions to SSRI will trial doxepin for insomnia.  Medications also indicated for depression anxiety patient was in agreement  to go forward with medication as it may improve his anxiety and depression symptoms.  Will refer patient for follow-up with  internal LCSW upon her return to office.   RTC: 3 months for insomnia. diabetes and hypertension follow-up. Return sooner if needed.   -The patient was given clear instructions to go to ER or return to medical center if symptoms do not improve, worsen or new problems develop. The patient verbalized understanding.    [name], [name] Primary Care at [place] [address], [place] [address] [phone]: [phone]       

## 2018-07-18 NOTE — Progress Notes (Deleted)
Blood pressure and DM and something to help him sleep

## 2018-07-18 NOTE — Patient Instructions (Addendum)
Cholesterol  Cholesterol is a fat. Your body needs a small amount of cholesterol. Cholesterol (plaque) may build up in your blood vessels (arteries). That makes you more likely to have a heart attack or stroke. You cannot feel your cholesterol level. Having a blood test is the only way to find out if your level is high. Keep your test results. Work with your doctor to keep your cholesterol at a good level. What do the results mean?  Total cholesterol is how much cholesterol is in your blood.  LDL is bad cholesterol. This is the type that can build up. Try to have low LDL.  HDL is good cholesterol. It cleans your blood vessels and carries LDL away. Try to have high HDL.  Triglycerides are fat that the body can store or burn for energy. What are good levels of cholesterol?  Total cholesterol below 200.  LDL below 100 is good for people who have health risks. LDL below 70 is good for people who have very high risks.  HDL above 40 is good. It is best to have HDL of 60 or higher.  Triglycerides below 150. How can I lower my cholesterol? Diet Follow your diet program as told by your doctor.  Choose fish, white meat chicken, or [place] that is roasted or baked. Try not to eat red meat, fried foods, sausage, or lunch meats.  Eat lots of fresh fruits and vegetables.  Choose whole grains, beans, pasta, potatoes, and cereals.  Choose olive oil, corn oil, or canola oil. Only use small amounts.  Try not to eat butter, mayonnaise, shortening, or palm kernel oils.  Try not to eat foods with trans fats.  Choose low-fat or nonfat dairy foods. ? Drink skim or nonfat milk. ? Eat low-fat or nonfat yogurt and cheeses. ? Try not to drink whole milk or cream. ? Try not to eat ice cream, egg yolks, or full-fat cheeses.  Healthy desserts include angel food cake, ginger snaps, animal crackers, hard candy, popsicles, and low-fat or nonfat frozen yogurt. Try not to eat pastries, cakes, pies, and  cookies.  Exercise Follow your exercise program as told by your doctor.  Be more active. Try gardening, walking, and taking the stairs.  Ask your doctor about ways that you can be more active. Medicine  Take over-the-counter and prescription medicines only as told by your doctor. This information is not intended to replace advice given to you by your health care provider. Make sure you discuss any questions you have with your health care provider. Document Released: 04/23/2008 Document Revised: 08/27/2015 Document Reviewed: 08/07/2015 Elsevier Interactive Patient Education  2019 Elsevier Inc.  Major Depressive Disorder, Adult Major depressive disorder (MDD) is a mental health condition. MDD often makes you feel sad, hopeless, or helpless. MDD can also cause symptoms in your body. MDD can affect your:  Work.  School.  Relationships.  Other normal activities. MDD can range from mild to very bad. It may occur once (single episode MDD). It can also occur many times (recurrent MDD). The main symptoms of MDD often include:  Feeling sad, depressed, or irritable most of the time.  Loss of interest. MDD symptoms also include:  Sleeping too much or too little.  Eating too much or too little.  A change in your weight.  Feeling tired (fatigue) or having low energy.  Feeling worthless.  Feeling guilty.  Trouble making decisions.  Trouble thinking clearly.  Thoughts of suicide or harming others.  Feeling weak.  Feeling agitated.  Keeping yourself from being around other people (isolation). Follow these instructions at home: Activity  Do these things as told by your doctor: ? Go back to your normal activities. ? Exercise regularly. ? Spend time outdoors. Alcohol  Talk with your doctor about how alcohol can affect your antidepressant medicines.  Do not drink alcohol. Or, limit how much alcohol you drink. ? This means no more than 1 drink a day for nonpregnant  women and 2 drinks a day for men. One drink equals one of these:  12 oz of beer.  5 oz of wine.  1 oz of hard liquor. General instructions  Take over-the-counter and prescription medicines only as told by your doctor.  Eat a healthy diet.  Get plenty of sleep.  Find activities that you enjoy. Make time to do them.  Think about joining a support group. Your doctor may be able to suggest a group for you.  Keep all follow-up visits as told by your doctor. This is important. Where to find more information:  [employer] Mental Illness: ? www.nami.org  [employer] of Mental Health: ? http://www.maynard.net/www.nimh.nih.gov  National Suicide Prevention Lifeline: ? 404 382 36881-(608)017-8772. This is free, 24-hour help. Contact a doctor if:  Your symptoms get worse.  You have new symptoms. Get help right away if:  You self-harm.  You see, hear, taste, smell, or feel things that are not present (hallucinate). If you ever feel like you may hurt yourself or others, or have thoughts about taking your own life, get help right away. You can go to your nearest emergency department or call:  Your local emergency services (911 in the [place]  A suicide crisis helpline, such as the National Suicide Prevention Lifeline: ? (475)609-82681-(608)017-8772. This is open 24 hours a day. This information is not intended to replace advice given to you by your health care provider. Make sure you discuss any questions you have with your health care provider. Document Released: 01/06/2015 Document Revised: 10/12/2015 Document Reviewed: 10/12/2015 Elsevier Interactive Patient Education  2019 Elsevier Inc.  Insomnia Insomnia is a sleep disorder that makes it difficult to fall asleep or stay asleep. Insomnia can cause fatigue, low energy, difficulty concentrating, mood swings, and poor performance at work or school. There are three different ways to classify insomnia:  Difficulty falling asleep.  Difficulty staying  asleep.  Waking up too early in the morning. Any type of insomnia can be long-term (chronic) or short-term (acute). Both are common. Short-term insomnia usually lasts for three months or less. Chronic insomnia occurs at least three times a week for longer than three months. What are the causes? Insomnia may be caused by another condition, situation, or substance, such as:  Anxiety.  Certain medicines.  Gastroesophageal reflux disease (GERD) or other gastrointestinal conditions.  Asthma or other breathing conditions.  Restless legs syndrome, sleep apnea, or other sleep disorders.  Chronic pain.  Menopause.  Stroke.  Abuse of alcohol, tobacco, or illegal drugs.  Mental health conditions, such as depression.  Caffeine.  Neurological disorders, such as Alzheimer's disease.  An overactive thyroid (hyperthyroidism). Sometimes, the cause of insomnia may not be known. What increases the risk? Risk factors for insomnia include:  Gender. Women are affected more often than men.  Age. Insomnia is more common as you get older.  Stress.  Lack of exercise.  Irregular work schedule or working night shifts.  Traveling between different time zones.  Certain medical and mental health conditions. What are the signs or symptoms? If  you have insomnia, the main symptom is having trouble falling asleep or having trouble staying asleep. This may lead to other symptoms, such as:  Feeling fatigued or having low energy.  Feeling nervous about going to sleep.  Not feeling rested in the morning.  Having trouble concentrating.  Feeling irritable, anxious, or depressed. How is this diagnosed? This condition may be diagnosed based on:  Your symptoms and medical history. Your health care provider may ask about: ? Your sleep habits. ? Any medical conditions you have. ? Your mental health.  A physical exam. How is this treated? Treatment for insomnia depends on the cause. Treatment  may focus on treating an underlying condition that is causing insomnia. Treatment may also include:  Medicines to help you sleep.  Counseling or therapy.  Lifestyle adjustments to help you sleep better. Follow these instructions at home: Eating and drinking   Limit or avoid alcohol, caffeinated beverages, and cigarettes, especially close to bedtime. These can disrupt your sleep.  Do not eat a large meal or eat spicy foods right before bedtime. This can lead to digestive discomfort that can make it hard for you to sleep. Sleep habits   Keep a sleep diary to help you and your health care provider figure out what could be causing your insomnia. Write down: ? When you sleep. ? When you wake up during the night. ? How well you sleep. ? How rested you feel the next day. ? Any side effects of medicines you are taking. ? What you eat and drink.  Make your bedroom a dark, comfortable place where it is easy to fall asleep. ? Put up shades or blackout curtains to block light from outside. ? Use a white noise machine to block noise. ? Keep the temperature cool.  Limit screen use before bedtime. This includes: ? Watching TV. ? Using your smartphone, tablet, or computer.  Stick to a routine that includes going to bed and waking up at the same times every day and night. This can help you fall asleep faster. Consider making a quiet activity, such as reading, part of your nighttime routine.  Try to avoid taking naps during the day so that you sleep better at night.  Get out of bed if you are still awake after 15 minutes of trying to sleep. Keep the lights down, but try reading or doing a quiet activity. When you feel sleepy, go back to bed. General instructions  Take over-the-counter and prescription medicines only as told by your health care provider.  Exercise regularly, as told by your health care provider. Avoid exercise starting several hours before bedtime.  Use relaxation  techniques to manage stress. Ask your health care provider to suggest some techniques that may work well for you. These may include: ? Breathing exercises. ? Routines to release muscle tension. ? Visualizing peaceful scenes.  Make sure that you drive carefully. Avoid driving if you feel very sleepy.  Keep all follow-up visits as told by your health care provider. This is important. Contact a health care provider if:  You are tired throughout the day.  You have trouble in your daily routine due to sleepiness.  You continue to have sleep problems, or your sleep problems get worse. Get help right away if:  You have serious thoughts about hurting yourself or someone else. If you ever feel like you may hurt yourself or others, or have thoughts about taking your own life, get help right away. You can go to your  nearest emergency department or call:  Your local emergency services (911 in the [place]  A suicide crisis helpline, such as the [place] at 724-579-3681. This is open 24 hours a day. Summary  Insomnia is a sleep disorder that makes it difficult to fall asleep or stay asleep.  Insomnia can be long-term (chronic) or short-term (acute).  Treatment for insomnia depends on the cause. Treatment may focus on treating an underlying condition that is causing insomnia.  Keep a sleep diary to help you and your health care provider figure out what could be causing your insomnia. This information is not intended to replace advice given to you by your health care provider. Make sure you discuss any questions you have with your health care provider. Document Released: 01/23/2000 Document Revised: 11/04/2016 Document Reviewed: 11/04/2016 Elsevier Interactive Patient Education  2019 [employer].

## 2018-07-22 LAB — BASIC METABOLIC PANEL
BUN/Creatinine Ratio: 12 (ref 10–24)
BUN: 13 mg/dL (ref 8–27)
CO2: 19 mmol/L — ABNORMAL LOW (ref 20–29)
Calcium: 9.5 mg/dL (ref 8.6–10.2)
Chloride: 98 mmol/L (ref 96–106)
Creatinine, Ser: 1.08 mg/dL (ref 0.76–1.27)
GFR calc Af Amer: 83 mL/min/{1.73_m2} (ref >59)
GFR calc non Af Amer: 72 mL/min/{1.73_m2} (ref >59)
Glucose: 128 mg/dL — ABNORMAL HIGH (ref 65–99)
Potassium: 4.2 mmol/L (ref 3.5–5.2)
Sodium: 136 mmol/L (ref 134–144)

## 2018-07-22 LAB — HEPATITIS C VRS RNA DETECT BY PCR-QUAL: HCV RNA NAA Qualitative: NEGATIVE

## 2018-07-22 LAB — LIPID PANEL
Chol/HDL Ratio: 6 ratio — ABNORMAL HIGH (ref 0.0–5.0)
Cholesterol, Total: 163 mg/dL (ref 100–199)
HDL: 27 mg/dL — ABNORMAL LOW (ref >39)
LDL Calculated: 88 mg/dL (ref 0–99)
Triglycerides: 242 mg/dL — ABNORMAL HIGH (ref 0–149)
VLDL Cholesterol Cal: 48 mg/dL — ABNORMAL HIGH (ref 5–40)

## 2018-07-22 LAB — HEMOGLOBIN A1C
Est. average glucose Bld gHb Est-mCnc: 143 mg/dL
Hgb A1c MFr Bld: 6.6 % — ABNORMAL HIGH (ref 4.8–5.6)

## 2018-07-26 ENCOUNTER — Telehealth: Payer: Self-pay | Admitting: Emergency Medicine

## 2018-07-26 NOTE — Progress Notes (Signed)
Patient notified of results & recommendations by Ellender Hose, RN.

## 2018-07-26 NOTE — Telephone Encounter (Signed)
Patient contacted via phone to be given results of labs.  Patient identified by name and date of birth.  Patient given results of labs.  Patient educated on lab results. Questions answered. Patient acknowledged understanding of labs results. 

## 2018-08-09 ENCOUNTER — Ambulatory Visit (INDEPENDENT_AMBULATORY_CARE_PROVIDER_SITE_OTHER): Payer: Medicare Other | Admitting: *Deleted

## 2018-08-09 DIAGNOSIS — I639 Cerebral infarction, unspecified: Secondary | ICD-10-CM

## 2018-08-10 LAB — CUP PACEART REMOTE DEVICE CHECK
Date Time Interrogation Session: 20200701223534
Implantable Pulse Generator Implant Date: 20191216

## 2018-08-18 NOTE — Progress Notes (Signed)
[facility] Summary Report / Loop Recorder 

## 2018-09-11 ENCOUNTER — Ambulatory Visit (INDEPENDENT_AMBULATORY_CARE_PROVIDER_SITE_OTHER): Payer: Medicare Other | Admitting: *Deleted

## 2018-09-11 DIAGNOSIS — I639 Cerebral infarction, unspecified: Secondary | ICD-10-CM | POA: Diagnosis not present

## 2018-09-12 LAB — CUP PACEART REMOTE DEVICE CHECK
Date Time Interrogation Session: 20200803233659
Implantable Pulse Generator Implant Date: 20191216

## 2018-09-20 NOTE — Progress Notes (Signed)
[facility] Summary Report / Loop Recorder 

## 2018-09-29 ENCOUNTER — Telehealth: Payer: Self-pay

## 2018-09-29 NOTE — Telephone Encounter (Signed)
Left message for patient regarding disconnected monitor.  

## 2018-10-17 ENCOUNTER — Ambulatory Visit (INDEPENDENT_AMBULATORY_CARE_PROVIDER_SITE_OTHER): Payer: Medicare Other | Admitting: *Deleted

## 2018-10-17 ENCOUNTER — Telehealth: Payer: Self-pay

## 2018-10-17 DIAGNOSIS — I639 Cerebral infarction, unspecified: Secondary | ICD-10-CM

## 2018-10-17 NOTE — Telephone Encounter (Signed)

## 2018-10-18 ENCOUNTER — Ambulatory Visit (INDEPENDENT_AMBULATORY_CARE_PROVIDER_SITE_OTHER): Payer: Medicare Other | Admitting: Nurse Practitioner

## 2018-10-18 ENCOUNTER — Other Ambulatory Visit: Payer: Self-pay

## 2018-10-18 VITALS — BP 145/89 | HR 95 | Temp 97.3°F | Resp 17 | Ht 72.0 in | Wt 237.8 lb

## 2018-10-18 DIAGNOSIS — E1159 Type 2 diabetes mellitus with other circulatory complications: Secondary | ICD-10-CM | POA: Diagnosis not present

## 2018-10-18 DIAGNOSIS — Z23 Encounter for immunization: Secondary | ICD-10-CM

## 2018-10-18 DIAGNOSIS — I6350 Cerebral infarction due to unspecified occlusion or stenosis of unspecified cerebral artery: Secondary | ICD-10-CM

## 2018-10-18 DIAGNOSIS — G4733 Obstructive sleep apnea (adult) (pediatric): Secondary | ICD-10-CM

## 2018-10-18 DIAGNOSIS — I10 Essential (primary) hypertension: Secondary | ICD-10-CM

## 2018-10-18 DIAGNOSIS — G4700 Insomnia, unspecified: Secondary | ICD-10-CM

## 2018-10-18 DIAGNOSIS — F331 Major depressive disorder, recurrent, moderate: Secondary | ICD-10-CM | POA: Diagnosis not present

## 2018-10-18 DIAGNOSIS — E785 Hyperlipidemia, unspecified: Secondary | ICD-10-CM | POA: Diagnosis not present

## 2018-10-18 LAB — GLUCOSE, POCT (MANUAL RESULT ENTRY): POC Glucose: 134 mg/dl — AB (ref 70–99)

## 2018-10-18 LAB — CUP PACEART REMOTE DEVICE CHECK
Date Time Interrogation Session: 20200906000744
Implantable Pulse Generator Implant Date: 20191216

## 2018-10-18 MED ORDER — DOXEPIN HCL 50 MG PO CAPS: 50.0000 mg | ORAL_CAPSULE | Freq: Every day | ORAL | 1 refills | Status: DC

## 2018-10-18 MED ORDER — AMLODIPINE BESYLATE 10 MG PO TABS: 10.0000 mg | ORAL_TABLET | Freq: Every day | ORAL | 1 refills | Status: DC

## 2018-10-18 MED ORDER — LISINOPRIL 20 MG PO TABS: 20.0000 mg | ORAL_TABLET | Freq: Every day | ORAL | 0 refills | Status: DC

## 2018-10-18 NOTE — Progress Notes (Signed)
Assessment & Plan:  Ricky Bright was seen today for diabetes, hypertension, hyperlipidemia and insomnia.  Diagnoses and all orders for this visit:  Type 2 diabetes mellitus with vascular disease ([place]) -     Glucose (CBG) -     Hemoglobin A1c -     Ambulatory referral to Ophthalmology Continue blood sugar control as discussed in office today, low carbohydrate diet, and regular physical exercise as tolerated, 150 minutes per week (30 min each day, 5 days per week, or 50 min 3 days per week). Keep blood sugar logs with fasting goal of 90-130 mg/dl, post prandial (after you eat) less than 180.  For Hypoglycemia: BS <60 and Hyperglycemia BS >400; contact the clinic ASAP. Annual eye exams and foot exams are recommended.   Essential hypertension Continue all antihypertensives as prescribed.  Remember to bring in your blood pressure log with you for your follow up appointment.  DASH/Mediterranean Diets are healthier choices for HTN.    Dyslipidemia -     Lipid panel INSTRUCTIONS: Work on a low fat, heart healthy diet and participate in regular aerobic exercise program by working out at least 150 minutes per week; 5 days a week-30 minutes per day. Avoid red meat, fried foods. junk foods, sodas, sugary drinks, unhealthy snacking, alcohol and smoking.  Drink at least 48oz of water per day and monitor your carbohydrate intake daily.    Insomnia, unspecified type -     doxepin (SINEQUAN) 50 MG capsule; Take 1 capsule (50 mg total) by mouth at bedtime.  Needs flu shot -     Flu Vaccine QUAD 6+ mos PF IM (Fluarix Quad PF)  OSA (obstructive sleep apnea) -     Split night study; Future    Patient has been counseled on age-appropriate routine health concerns for screening and prevention. These are reviewed and up-to-date. Referrals have been placed accordingly. Immunizations are up-to-date or declined.    Subjective:   Chief Complaint  Patient presents with  . Diabetes  . Hypertension  .  Hyperlipidemia  . Insomnia   HPI Ricky Bright. 63 y.o. male presents to office today for follow up.   has a past medical history of Diabetes mellitus without complication ([place]), HLD (hyperlipidemia), Hypercholesteremia, and Hypertension.  Essential Hypertension Monitoring blood pressure at home with readings averaging: 120/80s.  Currently taking lisinopril 20 mg and amlodipine 10 mg daily as prescribed. Denies chest pain, shortness of breath, palpitations, lightheadedness, dizziness, headaches or BLE edema.  BP Readings from Last 3 Encounters:  10/18/18 (!) 145/89  07/18/18 (!) 154/90  04/17/18 (!) 146/88    Depression PHQ 9 score is consistent with depression.  He is currently taking doxepin 25 mg as needed for insomnia.  At this time I will increase his doxepin to 50 mg nightly for depression omnia.  He denies any current thoughts of self-harm.  States he has been depressed since his stroke last year and has had re ocurring depression over the years since the death of both of his parents and retired from [employer]. Depression screen [facility] 2/9 10/18/2018 07/18/2018 07/18/2018 04/17/2018 01/20/2018  Decreased Interest 1 3 3 3 2   Down, Depressed, Hopeless 2 3 3 3 2   PHQ - 2 Score 3 6 6 6 4   Altered sleeping 3 3 3 3 2   Tired, decreased energy 3 3 3 3 2   Change in appetite 0 1 1 2 2   Feeling bad or failure about yourself  1 2 2 2  2  Trouble concentrating 1 2 2 2 2   Moving slowly or fidgety/restless 0 0 0 1 2  Suicidal thoughts 0 1 1 1 2   PHQ-9 Score 11 18 18 20 18   Difficult doing work/chores - Very difficult - - -    DM TYPE 2 Currently well controlled.  Patient is taking metformin 1000 mg daily.  He was started on gabapentin 600 mg nightly prn several months ago for peripheral neuropathy.  He currently denies any hypoglycemic symptoms.  Monitoring blood sugars at home daily with average readings 130s to 150s.  He is overdue for eye exam.  Referral has been made today. Lab Results   Component Value Date   HGBA1C 6.6 (H) 07/18/2018    Hyperlipidemia Patient presents for follow up to hyperlipidemia.  LDL not at goal of less than 70. He is medication compliant taking atorvastatin 80 mg daily as well as over-the-counter red yeast rice. He is not consistently diet compliant and denies  statin intolerance including myalgias.  Lab Results  Component Value Date   CHOL 163 07/18/2018   Lab Results  Component Value Date   HDL 27 (L) 07/18/2018   Lab Results  Component Value Date   LDLCALC 88 07/18/2018   Lab Results  Component Value Date   TRIG 242 (H) 07/18/2018   Lab Results  Component Value Date   CHOLHDL 6.0 (H) 07/18/2018    Sleep Apnea: Patient presents with possible obstructive sleep apnea. Patent has a history of symptoms of daytime fatigue, morning fatigue, tonsil enlargement and hypertension. Patient generally gets 5 or 6 hours of sleep per night, and states they generally have difficulty falling asleep, nightime awakenings and difficulty falling back asleep if awakened. Snoring of moderate severity is present. Apneic episodes are unknown as patient lives by himself.    Review of Systems  Constitutional: Positive for malaise/fatigue. Negative for fever and weight loss.  HENT: Negative.  Negative for nosebleeds.   Eyes: Negative.  Negative for blurred vision, double vision and photophobia.  Respiratory: Negative.  Negative for cough and shortness of breath.   Cardiovascular: Negative.  Negative for chest pain, palpitations and leg swelling.  Gastrointestinal: Negative.  Negative for heartburn, nausea and vomiting.  Musculoskeletal: Negative.  Negative for myalgias.  Neurological: Negative.  Negative for dizziness, focal weakness, seizures and headaches.  Psychiatric/Behavioral: Positive for depression. Negative for suicidal ideas. The patient is nervous/anxious and has insomnia.     Past Medical History:  Diagnosis Date  . Diabetes mellitus without  complication ([place])   . HLD (hyperlipidemia)   . Hypercholesteremia   . Hypertension     Past Surgical History:  Procedure Laterality Date  . APPENDECTOMY    . bicep tendon rupture.    [name] LOOP RECORDER INSERTION N/A 01/23/2018   Procedure: LOOP RECORDER INSERTION;  Surgeon: Evans Lance, MD;  Location: [facility];  Service: Cardiovascular;  Laterality: N/A;  . TEE WITHOUT CARDIOVERSION N/A 01/23/2018   Procedure: TRANSESOPHAGEAL ECHOCARDIOGRAM (TEE);  Surgeon: Larey Dresser, MD;  Location: [facility]COPY;  Service: Cardiovascular;  Laterality: N/A;    Family History  Problem Relation Age of Onset  . Stroke Father   . Stroke Paternal Grandfather     Social History Reviewed with no changes to be made today.   Outpatient Medications Prior to Visit  Medication Sig Dispense Refill  . amLODipine (NORVASC) 10 MG tablet Take 1 tablet (10 mg total) by mouth daily. 90 tablet 1  . aspirin 81 MG EC  tablet Take 1 tablet (81 mg total) by mouth daily. 30 tablet 1  . atorvastatin (LIPITOR) 80 MG tablet Take 1 tablet (80 mg total) by mouth daily at 6 PM. 90 tablet 1  . blood glucose meter kit and supplies Dispense based on patient and insurance preference. Use up to four times daily as directed. (FOR ICD-10 E10.9, E11.9). 1 each 0  . gabapentin (NEURONTIN) 300 MG capsule Take 2 capsules (600 mg total) by mouth at bedtime. 60 capsule 3  . lisinopril (ZESTRIL) 20 MG tablet Take 1 tablet by mouth daily.    . metFORMIN (GLUCOPHAGE) 1000 MG tablet Take 1 tablet by mouth daily.    [name] doxepin (SINEQUAN) 25 MG capsule Take 1 capsule (25 mg total) by mouth at bedtime as needed (as needed for sleep). (Patient not taking: Reported on 10/18/2018) 30 capsule 1  . lisinopril (ZESTRIL) 10 MG tablet TAKE 1 TABLET BY MOUTH TWICE DAILY 60 tablet 2  . metFORMIN (GLUCOPHAGE) 500 MG tablet Take 1 tablet (500 mg total) by mouth 2 (two) times daily with a meal. (Patient taking differently: Take 500 mg by mouth as  needed. ) 180 tablet 3   No facility-administered medications prior to visit.     Allergies  Allergen Reactions  . Trazodone And Nefazodone Shortness Of Breath  . Prozac [Fluoxetine Hcl]        Objective:    BP (!) 145/89   Pulse 95   Temp (!) 97.3 F (36.3 C) (Temporal)   Resp 17   Ht 6' (1.829 m)   Wt 237 lb 12.8 oz (107.9 kg)   SpO2 98%   BMI 32.25 kg/m  Wt Readings from Last 3 Encounters:  10/18/18 237 lb 12.8 oz (107.9 kg)  07/18/18 243 lb 9.6 oz (110.5 kg)  04/17/18 239 lb (108.4 kg)    Physical Exam Vitals signs and nursing note reviewed.  Constitutional:      Appearance: He is well-developed.     Comments: unkempt  HENT:     Head: Normocephalic and atraumatic.  Neck:     Musculoskeletal: Normal range of motion.  Cardiovascular:     Rate and Rhythm: Normal rate and regular rhythm.     Heart sounds: Normal heart sounds. No murmur. No friction rub. No gallop.   Pulmonary:     Effort: Pulmonary effort is normal. No tachypnea or respiratory distress.     Breath sounds: Normal breath sounds. No decreased breath sounds, wheezing, rhonchi or rales.  Chest:     Chest wall: No tenderness.  Abdominal:     General: Bowel sounds are normal.     Palpations: Abdomen is soft.  Musculoskeletal: Normal range of motion.  Skin:    General: Skin is warm and dry.  Neurological:     Mental Status: He is alert and oriented to person, place, and time.     Coordination: Coordination normal.  Psychiatric:        Behavior: Behavior normal. Behavior is cooperative.        Thought Content: Thought content normal.        Judgment: Judgment normal.         Patient has been counseled extensively about nutrition and exercise as well as the importance of adherence with medications and regular follow-up. The patient was given clear instructions to go to ER or return to medical center if symptoms don't improve, worsen or new problems develop. The patient verbalized understanding.    Follow-up: Return in about 3  weeks (around 11/08/2018) for insomnia.   Gildardo Pounds, [name]-BC [facility] and [facility], [place]   10/18/2018, 11:40 AM

## 2018-10-18 NOTE — Progress Notes (Signed)
States that he has started back seeing the [place].  Is watching diet & tries to walk daily.  States that the Doxepin didn't really help with sleep. Felt he was taking too much.

## 2018-10-18 NOTE — Patient Instructions (Signed)
Sleep Apnea Sleep apnea affects breathing during sleep. It causes breathing to stop for a short time or to become shallow. It can also increase the risk of:  Heart attack.  Stroke.  Being very overweight (obese).  Diabetes.  Heart failure.  Irregular heartbeat. The goal of treatment is to help you breathe normally again. What are the causes? There are three kinds of sleep apnea:  Obstructive sleep apnea. This is caused by a blocked or collapsed airway.  Central sleep apnea. This happens when the brain does not send the right signals to the muscles that control breathing.  Mixed sleep apnea. This is a combination of obstructive and central sleep apnea. The most common cause of this condition is a collapsed or blocked airway. This can happen if:  Your throat muscles are too relaxed.  Your tongue and tonsils are too large.  You are overweight.  Your airway is too small. What increases the risk?  Being overweight.  Smoking.  Having a small airway.  Being older.  Being male.  Drinking alcohol.  Taking medicines to calm yourself (sedatives or tranquilizers).  Having family members with the condition. What are the signs or symptoms?  Trouble staying asleep.  Being sleepy or tired during the day.  Getting angry a lot.  Loud snoring.  Headaches in the morning.  Not being able to focus your mind (concentrate).  Forgetting things.  Less interest in sex.  Mood swings.  Personality changes.  Feelings of sadness (depression).  Waking up a lot during the night to pee (urinate).  Dry mouth.  Sore throat. How is this diagnosed?  Your medical history.  A physical exam.  A test that is done when you are sleeping (sleep study). The test is most often done in a sleep lab but may also be done at home. How is this treated?   Sleeping on your side.  Using a medicine to get rid of mucus in your nose (decongestant).  Avoiding the use of alcohol,  medicines to help you relax, or certain pain medicines (narcotics).  Losing weight, if needed.  Changing your diet.  Not smoking.  Using a machine to open your airway while you sleep, such as: ? An oral appliance. This is a mouthpiece that shifts your lower jaw forward. ? A CPAP device. This device blows air through a mask when you breathe out (exhale). ? An EPAP device. This has valves that you put in each nostril. ? A BPAP device. This device blows air through a mask when you breathe in (inhale) and breathe out.  Having surgery if other treatments do not work. It is important to get treatment for sleep apnea. Without treatment, it can lead to:  High blood pressure.  Coronary artery disease.  In men, not being able to have an erection (impotence).  Reduced thinking ability. Follow these instructions at home: Lifestyle  Make changes that your doctor recommends.  Eat a healthy diet.  Lose weight if needed.  Avoid alcohol, medicines to help you relax, and some pain medicines.  Do not use any products that contain nicotine or tobacco, such as cigarettes, e-cigarettes, and chewing tobacco. If you need help quitting, ask your doctor. General instructions  Take over-the-counter and prescription medicines only as told by your doctor.  If you were given a machine to use while you sleep, use it only as told by your doctor.  If you are having surgery, make sure to tell your doctor you have sleep apnea. You   may need to bring your device with you.  Keep all follow-up visits as told by your doctor. This is important. Contact a doctor if:  The machine that you were given to use during sleep bothers you or does not seem to be working.  You do not get better.  You get worse. Get help right away if:  Your chest hurts.  You have trouble breathing in enough air.  You have an uncomfortable feeling in your back, arms, or stomach.  You have trouble talking.  One side of your  body feels weak.  A part of your face is hanging down. These symptoms may be an emergency. Do not wait to see if the symptoms will go away. Get medical help right away. Call your local emergency services (911 in the [place] Do not drive yourself to the hospital. Summary  This condition affects breathing during sleep.  The most common cause is a collapsed or blocked airway.  The goal of treatment is to help you breathe normally while you sleep. This information is not intended to replace advice given to you by your health care provider. Make sure you discuss any questions you have with your health care provider. Document Released: 11/04/2007 Document Revised: 11/11/2017 Document Reviewed: 09/20/2017 Elsevier Patient Education  2020 Elsevier Inc.  Major Depressive Disorder, Adult Major depressive disorder (MDD) is a mental health condition. MDD often makes you feel sad, hopeless, or helpless. MDD can also cause symptoms in your body. MDD can affect your:  Work.  School.  Relationships.  Other normal activities. MDD can range from mild to very bad. It may occur once (single episode MDD). It can also occur many times (recurrent MDD). The main symptoms of MDD often include:  Feeling sad, depressed, or irritable most of the time.  Loss of interest. MDD symptoms also include:  Sleeping too much or too little.  Eating too much or too little.  A change in your weight.  Feeling tired (fatigue) or having low energy.  Feeling worthless.  Feeling guilty.  Trouble making decisions.  Trouble thinking clearly.  Thoughts of suicide or harming others.  Feeling weak.  Feeling agitated.  Keeping yourself from being around other people (isolation). Follow these instructions at home: Activity  Do these things as told by your doctor: ? Go back to your normal activities. ? Exercise regularly. ? Spend time outdoors. Alcohol  Talk with your doctor about how alcohol can affect  your antidepressant medicines.  Do not drink alcohol. Or, limit how much alcohol you drink. ? This means no more than 1 drink a day for nonpregnant women and 2 drinks a day for men. One drink equals one of these:  12 oz of beer.  5 oz of wine.  1 oz of hard liquor. General instructions  Take over-the-counter and prescription medicines only as told by your doctor.  Eat a healthy diet.  Get plenty of sleep.  Find activities that you enjoy. Make time to do them.  Think about joining a support group. Your doctor may be able to suggest a group for you.  Keep all follow-up visits as told by your doctor. This is important. Where to find more information:  The [employer] on Mental Illness: ? www.nami.org  U.S. [employer] of Mental Health: ? http://www.maynard.net/www.nimh.nih.gov  National Suicide Prevention Lifeline: ? 660-887-64671-224-573-1075. This is free, 24-hour help. Contact a doctor if:  Your symptoms get worse.  You have new symptoms. Get help right away if:  You self-harm.  You see, hear, taste, smell, or feel things that are not present (hallucinate). If you ever feel like you may hurt yourself or others, or have thoughts about taking your own life, get help right away. You can go to your nearest emergency department or call:  Your local emergency services (911 in the [place]  A suicide crisis helpline, such as the National Suicide Prevention Lifeline: ? 502-179-3353. This is open 24 hours a day. This information is not intended to replace advice given to you by your health care provider. Make sure you discuss any questions you have with your health care provider. Document Released: 01/06/2015 Document Revised: 01/07/2017 Document Reviewed: 10/12/2015 Elsevier Patient Education  2020 [employer].

## 2018-10-19 LAB — HEMOGLOBIN A1C
Est. average glucose Bld gHb Est-mCnc: 140 mg/dL
Hgb A1c MFr Bld: 6.5 % — ABNORMAL HIGH (ref 4.8–5.6)

## 2018-10-19 LAB — LIPID PANEL
Chol/HDL Ratio: 6.3 ratio — ABNORMAL HIGH (ref 0.0–5.0)
Cholesterol, Total: 158 mg/dL (ref 100–199)
HDL: 25 mg/dL — ABNORMAL LOW (ref >39)
LDL Chol Calc (NIH): 85 mg/dL (ref 0–99)
Triglycerides: 288 mg/dL — ABNORMAL HIGH (ref 0–149)
VLDL Cholesterol Cal: 48 mg/dL — ABNORMAL HIGH (ref 5–40)

## 2018-10-24 NOTE — Progress Notes (Signed)
Patient notified of results & recommendations. Expressed understanding.

## 2018-10-27 ENCOUNTER — Encounter: Payer: Self-pay | Admitting: Licensed Clinical Social Worker

## 2018-11-01 NOTE — Progress Notes (Signed)
[facility] Summary Report / Loop Recorder 

## 2018-11-03 NOTE — Progress Notes (Signed)
Late Entry 10/27/2018  Call placed to patient. LCSW introduced self and explained role at [facility]. Pt was informed of consult from PCP.   Pt shared that his depression and anxiety symptoms are triggered from grief of both parents, chronic medical conditions, and financial strain. Pt was hospitalized Dec 2019 from having second stroke. He does not drive and is unable to play sports or hike (previous coping skills) Pt does not speak to siblings and does not want to stress adult son. He reports loneliness.  LCSW discussed correlation between one's physical and mental health. Validation and support was provided. Pt denies current suicidal or homicidal ideation, identifying adult son as protective factor. Pt has hx of medication mangement; however, states it "felt strange" He is not interested in medication management.   LCSW discussed strategies to assist pt with strengthening support system taking in account transportation barriers. Pt is open to receiving resources to assist with food insecurity and opportunities to meet with other people.   Pt was provided LCSW's contact information. LCSW will follow up with patient regarding local opportunities for engaging/volunteering with others. No additional concerns noted.

## 2018-11-10 DIAGNOSIS — H2513 Age-related nuclear cataract, bilateral: Secondary | ICD-10-CM | POA: Diagnosis not present

## 2018-11-10 DIAGNOSIS — Z8673 Personal history of transient ischemic attack (TIA), and cerebral infarction without residual deficits: Secondary | ICD-10-CM | POA: Diagnosis not present

## 2018-11-10 DIAGNOSIS — E119 Type 2 diabetes mellitus without complications: Secondary | ICD-10-CM | POA: Diagnosis not present

## 2018-11-16 ENCOUNTER — Ambulatory Visit (INDEPENDENT_AMBULATORY_CARE_PROVIDER_SITE_OTHER): Payer: Medicare Other | Admitting: *Deleted

## 2018-11-16 ENCOUNTER — Telehealth: Payer: Self-pay | Admitting: Licensed Clinical Social Worker

## 2018-11-16 DIAGNOSIS — I639 Cerebral infarction, unspecified: Secondary | ICD-10-CM

## 2018-11-16 NOTE — Telephone Encounter (Signed)
BHI placed call to pt to follow up about community engagement interest. Pt reported that he is not interested in community social supports at this time. Pt is having difficulty getting his medication through [place]. He was supposed to have Lisinopril sent to him via mail over a week ago and has been out of the medication for 7+ days. BHI encouraged pt to call [place] tomorrow and will follow up with patient on 11/20/18. Pt expressed interest in getting his [facility].

## 2018-11-17 LAB — CUP PACEART REMOTE DEVICE CHECK
Date Time Interrogation Session: 20201009001703
Implantable Pulse Generator Implant Date: 20191216

## 2018-11-17 NOTE — Progress Notes (Signed)
[facility] Summary Report / Loop Recorder 

## 2018-11-22 ENCOUNTER — Telehealth: Payer: Self-pay | Admitting: Licensed Clinical Social Worker

## 2018-11-22 NOTE — Telephone Encounter (Signed)
BHI placed call to pt to find out if he has gotten his medication yet via mail through the [place]. Pt did not answer. Left voicemail requesting pt to return call.

## 2018-11-29 ENCOUNTER — Telehealth: Payer: Self-pay | Admitting: *Deleted

## 2018-11-29 NOTE — Telephone Encounter (Signed)
LMOVM requesting call back to DC. Direct number and office hours given.  LINQ alert received via manual transmission due to disconnected monitor. New AF noted beginning on 10/31/18, duration ~6.5 days. Confirmed with Dr. Rayann Heman, recommended AF Clinic f/u to discuss [place].

## 2018-11-29 NOTE — Telephone Encounter (Signed)
Spoke with patient regarding episode. He was asymptomatic with episode. Pt is currently taking ASA 81mg . Explained findings and offered f/u in AF Clinic or [facility] office with EP APP. Pt accepted appointment on 12/08/18 at 3:15pm with Tommye Standard, PA as he has many other commitments this week and early next week. He is aware of office address and COVID restrictions. Pt aware to call back in the interim with any questions, denies any other concerns at this time.

## 2018-12-08 ENCOUNTER — Ambulatory Visit (INDEPENDENT_AMBULATORY_CARE_PROVIDER_SITE_OTHER): Payer: Medicare Other | Admitting: Physician Assistant

## 2018-12-08 ENCOUNTER — Other Ambulatory Visit: Payer: Self-pay

## 2018-12-08 VITALS — BP 158/98 | HR 96 | Ht 72.0 in | Wt 251.0 lb

## 2018-12-08 DIAGNOSIS — Z79899 Other long term (current) drug therapy: Secondary | ICD-10-CM

## 2018-12-08 DIAGNOSIS — I10 Essential (primary) hypertension: Secondary | ICD-10-CM | POA: Diagnosis not present

## 2018-12-08 DIAGNOSIS — I6350 Cerebral infarction due to unspecified occlusion or stenosis of unspecified cerebral artery: Secondary | ICD-10-CM | POA: Diagnosis not present

## 2018-12-08 DIAGNOSIS — I480 Paroxysmal atrial fibrillation: Secondary | ICD-10-CM | POA: Diagnosis not present

## 2018-12-08 NOTE — Progress Notes (Signed)
Cardiology Office Note Date:  12/08/2018  Patient ID:  Ricky Robideau., DOB 09/08/1955, MRN [id number] PCP:  No primary care provider on file.  Electrophysiologist  Dr. Lovena Le     Chief Complaint: new Afib detection  History of Present Illness: Ricky Bright. is a 63 y.o. male with history of HTN, HLD, DM, CVA dec 2019 > loop implant  Remote alert for disconnected monitor, manual transmission noted approx 6.5 days of AFib, was asymptomatic and comes today to discuss further.  He reports the power in house goes out frequently, and seems to have to keep up with the transmitter. He feels well.  Was surprised to hear that he had Afib for days since he had no symptoms of any kind.   No CP, palpitations or SOB, no dizziness, near syncope or syncope.  Not with the Afib or otherwise He always feels tired, no more the days of AF then any others.  Fatigue goes back years.  He denies any hematological history, no recent surgeries, no bleeding history    Device information MDT ILR, implanted 01/23/18 for cryptogenic stroke   Past Medical History:  Diagnosis Date  . Diabetes mellitus without complication ([place])   . HLD (hyperlipidemia)   . Hypercholesteremia   . Hypertension     Past Surgical History:  Procedure Laterality Date  . APPENDECTOMY    . bicep tendon rupture.    [name] LOOP RECORDER INSERTION N/A 01/23/2018   Procedure: LOOP RECORDER INSERTION;  Surgeon: Evans Lance, MD;  Location: [place] [facility];  Service: Cardiovascular;  Laterality: N/A;  . TEE WITHOUT CARDIOVERSION N/A 01/23/2018   Procedure: TRANSESOPHAGEAL ECHOCARDIOGRAM (TEE);  Surgeon: Larey Dresser, MD;  Location: [facility] ENDOSCOPY;  Service: Cardiovascular;  Laterality: N/A;    Current Outpatient Medications  Medication Sig Dispense Refill  . amLODipine (NORVASC) 10 MG tablet Take 1 tablet (10 mg total) by mouth daily. 90 tablet 1  . apixaban (ELIQUIS) 5 MG TABS tablet Take 5 mg by mouth 2  (two) times daily.    [name] aspirin 81 MG EC tablet Take 1 tablet (81 mg total) by mouth daily. 30 tablet 1  . atorvastatin (LIPITOR) 80 MG tablet Take 1 tablet (80 mg total) by mouth daily at 6 PM. 90 tablet 1  . blood glucose meter kit and supplies Dispense based on patient and insurance preference. Use up to four times daily as directed. (FOR ICD-10 E10.9, E11.9). 1 each 0  . doxepin (SINEQUAN) 50 MG capsule Take 1 capsule (50 mg total) by mouth at bedtime. (Patient taking differently: Take 25 mg by mouth at bedtime. ) 90 capsule 1  . gabapentin (NEURONTIN) 300 MG capsule Take 2 capsules (600 mg total) by mouth at bedtime. 60 capsule 3  . lisinopril (ZESTRIL) 20 MG tablet Take 1 tablet (20 mg total) by mouth daily. 90 tablet 0  . metFORMIN (GLUCOPHAGE) 1000 MG tablet Take 1 tablet by mouth daily.     No current facility-administered medications for this visit.     Allergies:   Trazodone and nefazodone and Prozac [fluoxetine hcl]   Social History:  The patient  reports that he has never smoked. He has never used smokeless tobacco. He reports previous alcohol use. He reports that he does not use drugs.   Family History:  The patient's family history includes Stroke in his father and paternal grandfather.  ROS:  Please see the history of present illness.  All other systems are reviewed and  otherwise negative.   PHYSICAL EXAM:  VS:  BP (!) 158/98   Pulse 96   Ht 6' (1.829 m)   Wt 251 lb (113.9 kg)   BMI 34.04 kg/m  BMI: Body mass index is 34.04 kg/m. Well nourished, well developed, in no acute distress  HEENT: normocephalic, atraumatic  Neck: no JVD, carotid bruits or masses Cardiac:  RRR; no significant murmurs, no rubs, or gallops Lungs:  CTA b/l, no wheezing, rhonchi or rales  Abd: soft, nontender MS: no deformity or atrophy Ext: no edema  Skin: warm and dry, no rash Neuro:  No gross deficits appreciated Psych: euthymic mood, full affect  ILR site is stable, no tethering or  discomfort   EKG:  Done today and reviewed by myself shows Sr, LAD, poor R progression, no significant change from prior ILR interrogation done today and reviewed by myself: Battery is good R waves 0.26-0.32 1 Afib episode Rate 130's, duration >99hours Morphology of the arrhythmia changes, is very irregular   01/23/18: TEE Findings: Please see echo section for full report.  Normal LV size with mild to moderate LV hypertrophy.  EF 55-60%. Mild septal bounce noted suggesting LBBB.  Normal RV size and systolic function.  Normal left and right atrial sizes.  No LA appendage thrombus.  There was a small PFO noted by color doppler and bubble study.  No significant tricuspid regurgitation.  Trivial MR.  Trileaflet aortic valve with trivial AI.  No aortic stenosis.  The aortic root was dilated to 4.4 cm, the ascending aorta measured 4.0 cm.  There was mild plaque in the descending thoracic aorta.   Impression: PFO noted.   01/23/18: LE venous doppler  Summary: Right: There is no evidence of deep vein thrombosis in the lower extremity. No cystic structure found in the popliteal fossa. Left: There is no evidence of deep vein thrombosis in the lower extremity. No cystic structure found in the popliteal fossa.   01/21/2018: TTE Study Conclusions - Left ventricle: The cavity size was normal. Wall thickness was increased in a pattern of mild LVH. Systolic function was normal. Although no diagnostic regional wall motion abnormality was identified, this possibility cannot be completely excluded on the basis of this study. Doppler parameters are consistent with abnormal left ventricular relaxation (grade 1 diastolic dysfunction). - Ventricular septum: Abnormal septal motion likely secondary to conduction delay. - Aortic valve: Trileaflet; mildly thickened leaflets. Transvalvular velocity was within the normal range. There was no stenosis. There was trivial regurgitation. Mean  gradient (S): 5 mm Hg. Valve area (VTI): 3.29 cm^2. Valve area (Vmax): 2.67 cm^2. Valve area (Vmean): 2.7 cm^2. - Aortic root: The aortic root was dilated, measuring 49 mm. - Ascending aorta: The ascending aorta was dilated, measuring 43 mm in the proximal segment. - Mitral valve: Mildly calcified annulus. Chordal calcification. Transvalvular velocity was within the normal range. There was no evidence for stenosis. There was trivial regurgitation. - Left atrium: The atrium was normal in size. - Right ventricle: The cavity size was normal. Wall thickness was normal. Systolic function was normal. RV systolic pressure (S, est): 14 mm Hg. - Right atrium: Central venous pressure (est): 8 mm Hg. - Tricuspid valve: There was trivial regurgitation. - Inferior vena cava: The vessel was normal in size. The respirophasic diameter changes were blunted (<50%). - Pericardium, extracardiac: There was no pericardial effusion. Impressions: - No cardiac source of embolism was identified, but cannot be ruled out on the basis of this examination due to image quality.  Recommendations: Recommend CT angiogram of the aorta (ECG gated or flash scanned) to evaluate dilated ascending aorta      Recent Labs: 01/24/2018: ALT 19; Hemoglobin 16.4; Platelets 267 04/17/2018: TSH 2.190 07/18/2018: BUN 13; Creatinine, Ser 1.08; Potassium 4.2; Sodium 136  01/20/2018: VLDL 22 10/18/2018: Chol/HDL Ratio 6.3; Cholesterol, Total 158; HDL 25; LDL Chol Calc (NIH) 85; Triglycerides 288   CrCl cannot be calculated (Patient's most recent lab result is older than the maximum 21 days allowed.).   Wt Readings from Last 3 Encounters:  12/08/18 251 lb (113.9 kg)  10/18/18 237 lb 12.8 oz (107.9 kg)  07/18/18 243 lb 9.6 oz (110.5 kg)     Other studies reviewed: Additional studies/records reviewed today include: summarized above  ASSESSMENT AND PLAN:  1. New Paroxysmal Afib     CHA2DS2Vasc is  4  He had  chronic hemorrhage in the deep white matter on the right (noted at the time of his stroke Dec 2019), I reviewed this with neurology NP, not a contraindication for anticoagulation, likely 2/2 HTN  We discussed AFib and mechanism for embolic events/stroke Discussed potential benefits and risks of OAC, anticoagulation Discussed bleeding, signs of bleeding Recommend we stop AS and start Eliquis 56m BID (based on labs in June)  The patient is agreeable [facility] [place] [facility] [place]  I offerred to given him a written Rx to bring, but he prefers not to, states they just check his notes and prescribe it.  Offered to send in to his pharmacy to have , though again he says no, he wants to wait to Monday to call his [facility] MD and have them manage his Rx  Update BMETand CBC today   2. HTN     Recheck of his BP 150/90, he reports at home is much better 120's-130's, and always hig at the MD office     Disposition: F/u with [place] weeks given new to anticoagulation, he is asked to keep daily log of his BP and bring his cuff to correlate his readings.    Current medicines are reviewed at length with the patient today.  The patient did not have any concerns regarding medicines.  SVenetia Night PA-C 12/08/2018 4:23 PM     [place] [phone])  [phone](fax)

## 2018-12-08 NOTE — Patient Instructions (Addendum)
Medication Instructions:    START TAKING ELIQUIS 5 MG TWICE A DAY   *If you need a refill on your cardiac medications before your next appointment, please call your pharmacy*  Lab Work:  BMET AND  [place]  If you have labs (blood work) drawn today and your tests are completely normal, you will receive your results only by: [name] MyChart Message (if you have MyChart) OR . A paper copy in the mail If you have any lab test that is abnormal or we need to change your treatment, we will call you to review the results.  Testing/Procedures: NONE ORDERED  TODAY  Follow-Up: At [facility], you and your health needs are our priority.  As part of our continuing mission to provide you with exceptional heart care, we have created designated Provider Care Teams.  These Care Teams include your primary Cardiologist (physician) and Advanced Practice Providers (APPs -  Physician Assistants and Nurse Practitioners) who all work together to provide you with the care you need, when you need it.  Your next appointment:   3-4 weeks  The format for your next appointment:   In Person  Provider:   You may see  or one of the following Advanced Practice Providers on your designated Care Team:    Chanetta Marshall, NP  Tommye Standard, PA-C  Legrand Como "Oda [place], [place]   Other Instructions

## 2018-12-09 LAB — BASIC METABOLIC PANEL
BUN/Creatinine Ratio: 10 (ref 10–24)
BUN: 11 mg/dL (ref 8–27)
CO2: 22 mmol/L (ref 20–29)
Calcium: 9.7 mg/dL (ref 8.6–10.2)
Chloride: 99 mmol/L (ref 96–106)
Creatinine, Ser: 1.12 mg/dL (ref 0.76–1.27)
GFR calc Af Amer: 79 mL/min/{1.73_m2} (ref >59)
GFR calc non Af Amer: 69 mL/min/{1.73_m2} (ref >59)
Glucose: 124 mg/dL — ABNORMAL HIGH (ref 65–99)
Potassium: 4.1 mmol/L (ref 3.5–5.2)
Sodium: 137 mmol/L (ref 134–144)

## 2018-12-09 LAB — MAGNESIUM: Magnesium: 2 mg/dL (ref 1.6–2.3)

## 2018-12-12 ENCOUNTER — Other Ambulatory Visit: Payer: Self-pay | Admitting: *Deleted

## 2018-12-12 DIAGNOSIS — Z79899 Other long term (current) drug therapy: Secondary | ICD-10-CM

## 2018-12-14 ENCOUNTER — Other Ambulatory Visit: Payer: Medicare Other

## 2018-12-15 ENCOUNTER — Telehealth: Payer: Self-pay | Admitting: Physician Assistant

## 2018-12-15 NOTE — Telephone Encounter (Signed)
Did not need this encounter °

## 2018-12-18 ENCOUNTER — Other Ambulatory Visit: Payer: Self-pay

## 2018-12-18 ENCOUNTER — Other Ambulatory Visit: Payer: Medicare Other | Admitting: *Deleted

## 2018-12-18 DIAGNOSIS — Z79899 Other long term (current) drug therapy: Secondary | ICD-10-CM

## 2018-12-18 LAB — CBC
Hematocrit: 43.5 % (ref 37.5–51.0)
Hemoglobin: 15.6 g/dL (ref 13.0–17.7)
MCH: 31 pg (ref 26.6–33.0)
MCHC: 35.9 g/dL — ABNORMAL HIGH (ref 31.5–35.7)
MCV: 87 fL (ref 79–97)
Platelets: 314 10*3/uL (ref 150–450)
RBC: 5.03 x10E6/uL (ref 4.14–5.80)
RDW: 13.2 % (ref 11.6–15.4)
WBC: 9.1 10*3/uL (ref 3.4–10.8)

## 2018-12-19 ENCOUNTER — Ambulatory Visit (INDEPENDENT_AMBULATORY_CARE_PROVIDER_SITE_OTHER): Payer: Medicare Other | Admitting: *Deleted

## 2018-12-19 DIAGNOSIS — I639 Cerebral infarction, unspecified: Secondary | ICD-10-CM | POA: Diagnosis not present

## 2018-12-19 LAB — CUP PACEART REMOTE DEVICE CHECK
Date Time Interrogation Session: 20201110211034
Implantable Pulse Generator Implant Date: 20191216

## 2018-12-21 ENCOUNTER — Other Ambulatory Visit: Payer: Self-pay | Admitting: *Deleted

## 2018-12-21 NOTE — Progress Notes (Unsigned)
Spoke with patient about lab results. Patient stated that the [place] in [place] provider will not get his Eliquis prescribed for another month or so.  Patient wants a 30 day supply with a couple of refills sent to [place]  to hold over until [place] provider will prescribed ir for him. This will allow him to st least have started taken it.

## 2018-12-22 MED ORDER — APIXABAN 5 MG PO TABS: 5.0000 mg | ORAL_TABLET | Freq: Two times a day (BID) | ORAL | 2 refills | Status: DC

## 2018-12-31 NOTE — Progress Notes (Signed)
Electrophysiology Office Note Date: 01/02/2019  ID:  [name], DOB January 13, 1956, MRN [id number]  PCP: Gildardo Pounds, NP Primary Cardiologist: No primary care provider on file. Electrophysiologist: Dr Lovena Le  CC: AF follow-up  Ricky Bright. is a 63 y.o. male seen today for Dr. Lovena Le. he presents today for routine electrophysiology followup.  Since last being seen in our clinic, the patient reports doing very well.  he denies chest pain, palpitations, dyspnea, PND, orthopnea, nausea, vomiting, dizziness, syncope, edema, weight gain, or early satiety.  He has not yet started Eliquis. He called and requested a prescription be sent to [facility] (it was); but he was still using this as a "back up" prescription as he had not heard back yet from [place].   Device History: Medtronic ILR implanted 01/2018 for cryptogenic stroke -> AF identified 11/2018  Past Medical History:  Diagnosis Date  . Diabetes mellitus without complication ([place])   . HLD (hyperlipidemia)   . Hypercholesteremia   . Hypertension    Past Surgical History:  Procedure Laterality Date  . APPENDECTOMY    . bicep tendon rupture.    [name] LOOP RECORDER INSERTION N/A 01/23/2018   Procedure: LOOP RECORDER INSERTION;  Surgeon: Evans Lance, MD;  Location: [facility];  Service: Cardiovascular;  Laterality: N/A;  . TEE WITHOUT CARDIOVERSION N/A 01/23/2018   Procedure: TRANSESOPHAGEAL ECHOCARDIOGRAM (TEE);  Surgeon: Larey Dresser, MD;  Location: [facility] ENDOSCOPY;  Service: Cardiovascular;  Laterality: N/A;    Current Outpatient Medications  Medication Sig Dispense Refill  . amLODipine (NORVASC) 10 MG tablet Take 1 tablet (10 mg total) by mouth daily. 90 tablet 1  . apixaban (ELIQUIS) 5 MG TABS tablet Take 1 tablet (5 mg total) by mouth 2 (two) times daily. 60 tablet 2  . atorvastatin (LIPITOR) 80 MG tablet Take 1 tablet (80 mg total) by mouth daily at 6 PM. 90 tablet 1  . blood glucose meter  kit and supplies Dispense based on patient and insurance preference. Use up to four times daily as directed. (FOR ICD-10 E10.9, E11.9). 1 each 0  . doxepin (SINEQUAN) 50 MG capsule Take 1 capsule (50 mg total) by mouth at bedtime. (Patient taking differently: Take 25 mg by mouth at bedtime. ) 90 capsule 1  . gabapentin (NEURONTIN) 300 MG capsule Take 2 capsules (600 mg total) by mouth at bedtime. 60 capsule 3  . lisinopril (ZESTRIL) 20 MG tablet Take 1 tablet (20 mg total) by mouth daily. 90 tablet 0  . metFORMIN (GLUCOPHAGE) 1000 MG tablet Take 1 tablet by mouth daily.     No current facility-administered medications for this visit.     Allergies:   Trazodone and nefazodone and Prozac [fluoxetine hcl]   Social History: Social History   Socioeconomic History  . Marital status: Unknown    Spouse name: Not on file  . Number of children: Not on file  . Years of education: Not on file  . Highest education level: Not on file  Occupational History  . Not on file  Social Needs  . Financial resource strain: Not on file  . Food insecurity    Worry: Not on file    Inability: Not on file  . Transportation needs    Medical: Not on file    Non-medical: Not on file  Tobacco Use  . Smoking status: Never Smoker  . Smokeless tobacco: Never Used  Substance and Sexual Activity  . Alcohol use: Not Currently  .  Drug use: Never  . Sexual activity: Not on file  Lifestyle  . Physical activity    Days per week: Not on file    Minutes per session: Not on file  . Stress: Not on file  Relationships  . Social Herbalist on phone: Not on file    Gets together: Not on file    Attends religious service: Not on file    Active member of club or organization: Not on file    Attends meetings of clubs or organizations: Not on file    Relationship status: Not on file  . Intimate partner violence    Fear of current or ex partner: Not on file    Emotionally abused: Not on file    Physically  abused: Not on file    Forced sexual activity: Not on file  Other Topics Concern  . Not on file  Social History Narrative  . Not on file    Family History: Family History  Problem Relation Age of Onset  . Stroke Father   . Stroke Paternal Grandfather      Review of Systems: All other systems reviewed and are otherwise negative except as noted above.  Physical Exam: Vitals:   01/02/19 0922  BP: (!) 144/84  Pulse: 96  SpO2: 99%  Weight: 252 lb (114.3 kg)  Height: 6' (1.829 m)     GEN- The patient is well appearing, alert and oriented x 3 today.   HEENT: normocephalic, atraumatic; sclera clear, conjunctiva pink; hearing intact; oropharynx clear; neck supple  Lungs- Clear to ausculation bilaterally, normal work of breathing.  No wheezes, rales, rhonchi Heart- Regular rate and rhythm, no murmurs, rubs or gallops  GI- soft, non-tender, non-distended, bowel sounds present  Extremities- no clubbing, cyanosis, or edema  MS- no significant deformity or atrophy Skin- warm and dry, no rash or lesion; ILR pocket well healed Psych- euthymic mood, full affect Neuro- strength and sensation are intact  ILR Interrogation- [facility] data from 01/01/2019 reviewed. Pt has had no AF since last check.   EKG:  EKG is not ordered today.  Recent Labs: 01/24/2018: ALT 19 04/17/2018: TSH 2.190 12/08/2018: BUN 11; Creatinine, Ser 1.12; Magnesium 2.0; Potassium 4.1; Sodium 137 12/18/2018: Hemoglobin 15.6; Platelets 314   Wt Readings from Last 3 Encounters:  01/02/19 252 lb (114.3 kg)  12/08/18 251 lb (113.9 kg)  10/18/18 237 lb 12.8 oz (107.9 kg)     Other studies Reviewed: Additional studies/ records that were reviewed today include: Previous EP office notes, Previous remote checks, Most recent labwork.   Assessment and Plan:  1.  Cryptogenic stroke  01/2018 with no remaining deficits. Pt doing well overall.   2. Paroxysmal Afib He has not yet started on Eliquis for newly identified  AF. CHA2DS2VASC of 5   Denies bleeding, but as above, has not yet started.  Recommended he pick up prescription that was sent in, and follow up with [place].   3. HTN Continue current medications He forgot his BP readings, but states they run in 120-140s.  Asked him to bring his reading to next visit, once the situation with the Eliquis is resolved.   Current medicines are reviewed at length with the patient today.   The patient does not have concerns regarding his medicines.  The following changes were made today:  none  Labs/ tests ordered today include:  No orders of the defined types were placed in this encounter.  Disposition:   Follow up  with EP APP in 6 Weeks. Pt is to call and let us know if he cannot pick up the prescription from [facility] due to cost.      Signed, Shirley Friar, PA-C  01/02/2019 9:25 AM  [facility] [address] [place] [address] [phone] (office) [phone] (fax)

## 2019-01-02 ENCOUNTER — Other Ambulatory Visit: Payer: Self-pay

## 2019-01-02 ENCOUNTER — Ambulatory Visit (INDEPENDENT_AMBULATORY_CARE_PROVIDER_SITE_OTHER): Payer: Medicare Other | Admitting: Student

## 2019-01-02 VITALS — BP 144/84 | HR 96 | Ht 72.0 in | Wt 252.0 lb

## 2019-01-02 DIAGNOSIS — I639 Cerebral infarction, unspecified: Secondary | ICD-10-CM

## 2019-01-02 DIAGNOSIS — I480 Paroxysmal atrial fibrillation: Secondary | ICD-10-CM

## 2019-01-02 DIAGNOSIS — I6350 Cerebral infarction due to unspecified occlusion or stenosis of unspecified cerebral artery: Secondary | ICD-10-CM | POA: Diagnosis not present

## 2019-01-02 DIAGNOSIS — I10 Essential (primary) hypertension: Secondary | ICD-10-CM

## 2019-01-02 NOTE — Patient Instructions (Signed)
Medication Instructions:  Your physician recommends that you continue on your current medications as directed. Please refer to the Current Medication list given to you today.   *If you need a refill on your cardiac medications before your next appointment, please call your pharmacy*  Lab Work: [place]   If you have labs (blood work) drawn today and your tests are completely normal, you will receive your results only by: [name] MyChart Message (if you have MyChart) OR . A paper copy in the mail If you have any lab test that is abnormal or we need to change your treatment, we will call you to review the results.  Testing/Procedures: NONE ORDERED  TODAY    Follow-Up: At [facility], you and your health needs are our priority.  As part of our continuing mission to provide you with exceptional heart care, we have created designated Provider Care Teams.  These Care Teams include your primary Cardiologist (physician) and Advanced Practice Providers (APPs -  Physician Assistants and Nurse Practitioners) who all work together to provide you with the care you need, when you need it.  Your next appointment:   6 week(s)  The format for your next appointment:   In Person  Provider:   You may see  one of the following Advanced Practice Providers on your designated Care Team:    Chanetta Marshall, NP  Tommye Standard, PA-C  Legrand Como "Oda [place], [place]   Other Instructions

## 2019-01-08 NOTE — Progress Notes (Signed)
[facility] Summary Report / Loop Recorder 

## 2019-01-17 ENCOUNTER — Ambulatory Visit (INDEPENDENT_AMBULATORY_CARE_PROVIDER_SITE_OTHER): Payer: Medicare Other | Admitting: Nurse Practitioner

## 2019-01-17 DIAGNOSIS — F331 Major depressive disorder, recurrent, moderate: Secondary | ICD-10-CM | POA: Diagnosis not present

## 2019-01-17 DIAGNOSIS — E1159 Type 2 diabetes mellitus with other circulatory complications: Secondary | ICD-10-CM

## 2019-01-17 DIAGNOSIS — G4700 Insomnia, unspecified: Secondary | ICD-10-CM | POA: Diagnosis not present

## 2019-01-17 DIAGNOSIS — I10 Essential (primary) hypertension: Secondary | ICD-10-CM

## 2019-01-17 MED ORDER — LISINOPRIL 20 MG PO TABS: 20.0000 mg | ORAL_TABLET | Freq: Every day | ORAL | 1 refills | Status: AC

## 2019-01-17 MED ORDER — ONETOUCH DELICA LANCING DEV MISC: 99 refills | Status: AC

## 2019-01-17 MED ORDER — ATORVASTATIN CALCIUM 80 MG PO TABS: 80.0000 mg | ORAL_TABLET | Freq: Every day | ORAL | 1 refills | Status: AC

## 2019-01-17 MED ORDER — ONETOUCH DELICA LANCETS 30G MISC: 1.0000 | Freq: Two times a day (BID) | 12 refills | Status: AC

## 2019-01-17 MED ORDER — APIXABAN 5 MG PO TABS: 5.0000 mg | ORAL_TABLET | Freq: Two times a day (BID) | ORAL | 2 refills | Status: AC

## 2019-01-17 MED ORDER — GLUCOSE BLOOD VI STRP: ORAL_STRIP | 12 refills | Status: AC

## 2019-01-17 MED ORDER — METFORMIN HCL 1000 MG PO TABS: 1000.0000 mg | ORAL_TABLET | Freq: Every day | ORAL | 1 refills | Status: AC

## 2019-01-17 MED ORDER — DOXEPIN HCL 50 MG PO CAPS: 50.0000 mg | ORAL_CAPSULE | Freq: Every day | ORAL | 1 refills | Status: AC

## 2019-01-17 MED ORDER — AMLODIPINE BESYLATE 10 MG PO TABS: 10.0000 mg | ORAL_TABLET | Freq: Every day | ORAL | 1 refills | Status: AC

## 2019-01-17 MED ORDER — ONETOUCH VERIO W/DEVICE KIT: PACK | 0 refills | Status: AC

## 2019-01-17 MED ORDER — ESCITALOPRAM OXALATE 10 MG PO TABS: 10.0000 mg | ORAL_TABLET | Freq: Every day | ORAL | 0 refills | Status: AC

## 2019-01-17 NOTE — Progress Notes (Signed)
Virtual Visit via Telephone Note Due to national recommendations of social distancing due to [place] 19, telehealth visit is felt to be most appropriate for this patient at this time.  I discussed the limitations, risks, security and privacy concerns of performing an evaluation and management service by telephone and the availability of in person appointments. I also discussed with the patient that there may be a patient responsible charge related to this service. The patient expressed understanding and agreed to proceed.    I connected with Ricky Bright. on 01/17/19  at   8:30 AM EST  EDT by telephone and verified that I am speaking with the correct person using two identifiers.   Consent I discussed the limitations, risks, security and privacy concerns of performing an evaluation and management service by telephone and the availability of in person appointments. I also discussed with the patient that there may be a patient responsible charge related to this service. The patient expressed understanding and agreed to proceed.   Location of Patient: Private  Residence   Location of Provider: [place] [facility] [place] participating in Telemedicine visit: Geryl Rankins [name]-BC [place].    History of Present Illness: Telemedicine visit for: Follow up  has a past medical history of Diabetes mellitus without complication ([place]), HLD (hyperlipidemia), Hypercholesteremia, and Hypertension.    Endorsing symptoms of depression. Taking doxepin for insomnia and depression however does not feel his depression is improving. He does state his insomnia is now completely resolved. PHq9 score is increased. Symptom include:  anhedonia, depressed mood, fatigue, feelings of worthlessness/guilt and hopelessness. Onset was approximately several months ago, gradually worsening since his stroke 01-2018.  He denies current suicidal and homicidal plan or  intent. He has an adult son who he states does not have any time for him and rarely comes to visit. Risk factors: negative life event STROKE  Previous treatment includes doxepin. Depression screen [facility] 2/9 01/17/2019 10/18/2018 07/18/2018 07/18/2018 04/17/2018  Decreased Interest _0 Down, Depressed, Hopeless _1 PHQ - 2 Score _2 Altered sleeping _3 Tired, decreased energy _4 Change in appetite 2 0 _5 Feeling bad or failure about yourself  _6 Trouble concentrating 0 _7 Moving slowly or fidgety/restless 0 0 0 0 1  Suicidal thoughts 1 0 _8 PHQ-9 Score _9 Difficult doing work/chores Somewhat difficult - Very difficult - -      DM TYPE 2 Monitoring his blood glucose levels once per day.  Average Postprandial: 130-140s. Taking metformin 1000 mg daily as prescribed. A1C at goal of <7.0 Takes gabapentin 600 mg prn for neuropathy. Taking statin and ACE. Denies any symptoms of hypo or hyperglycemia.  Lab Results  Component Value Date   HGBA1C 6.5 (H) 10/18/2018    Essential Hypertension Monitoring blood pressure at home. Most recent reading: 142/84. Taking lisinopril 20 mg daily and amlodipine 10 mg daily as prescribed. Denies chest pain, shortness of breath, palpitations, lightheadedness, dizziness, headaches or BLE edema.   BP Readings from Last 3 Encounters:  01/02/19 (!) 144/84  12/08/18 (!) 158/98  10/18/18 (!) 145/89    Past Medical History:  Diagnosis Date  . Diabetes mellitus without complication ([place])   . HLD (hyperlipidemia)   .  Hypercholesteremia   . Hypertension     Past Surgical History:  Procedure Laterality Date  . APPENDECTOMY    . bicep tendon rupture.    [name] LOOP RECORDER INSERTION N/A 01/23/2018   Procedure: LOOP RECORDER INSERTION;  Surgeon: Evans Lance, MD;  Location: [facility];  Service: Cardiovascular;  Laterality: N/A;  . TEE WITHOUT CARDIOVERSION N/A 01/23/2018   Procedure:  TRANSESOPHAGEAL ECHOCARDIOGRAM (TEE);  Surgeon: Larey Dresser, MD;  Location: [facility] ENDOSCOPY;  Service: Cardiovascular;  Laterality: N/A;    Family History  Problem Relation Age of Onset  . Stroke Father   . Stroke Paternal Grandfather     Social History   Socioeconomic History  . Marital status: Unknown    Spouse name: Not on file  . Number of children: Not on file  . Years of education: Not on file  . Highest education level: Not on file  Occupational History  . Not on file  Social Needs  . Financial resource strain: Not on file  . Food insecurity    Worry: Not on file    Inability: Not on file  . Transportation needs    Medical: Not on file    Non-medical: Not on file  Tobacco Use  . Smoking status: Never Smoker  . Smokeless tobacco: Never Used  Substance and Sexual Activity  . Alcohol use: Not Currently  . Drug use: Never  . Sexual activity: Not on file  Lifestyle  . Physical activity    Days per week: Not on file    Minutes per session: Not on file  . Stress: Not on file  Relationships  . Social Herbalist on phone: Not on file    Gets together: Not on file    Attends religious service: Not on file    Active member of club or organization: Not on file    Attends meetings of clubs or organizations: Not on file    Relationship status: Not on file  Other Topics Concern  . Not on file  Social History Narrative  . Not on file     Observations/Objective: Awake, alert and oriented x 3   Review of Systems  Constitutional: Negative for fever, malaise/fatigue and weight loss.  HENT: Negative.  Negative for nosebleeds.   Eyes: Negative.  Negative for blurred vision, double vision and photophobia.  Respiratory: Negative.  Negative for cough and shortness of breath.   Cardiovascular: Negative.  Negative for chest pain, palpitations and leg swelling.  Gastrointestinal: Negative.  Negative for heartburn, nausea and vomiting.  Musculoskeletal: Negative.   Negative for myalgias.  Neurological: Negative.  Negative for dizziness, focal weakness, seizures and headaches.  Psychiatric/Behavioral: Positive for depression. Negative for suicidal ideas. The patient is nervous/anxious and has insomnia.     Assessment and Plan: Jef was seen today for diabetes, hypertension, hyperlipidemia and insomnia.  Diagnoses and all orders for this visit:  Essential hypertension -     lisinopril (ZESTRIL) 20 MG tablet; Take 1 tablet (20 mg total) by mouth daily.  Insomnia, unspecified type -     doxepin (SINEQUAN) 50 MG capsule; Take 1 capsule (50 mg total) by mouth at bedtime.  Moderate episode of recurrent major depressive disorder (HCC) -     doxepin (SINEQUAN) 50 MG capsule; Take 1 capsule (50 mg total) by mouth at bedtime.  Type 2 diabetes mellitus with vascular disease (HCC) -     metFORMIN (GLUCOPHAGE) 1000 MG tablet; Take 1 tablet (1,000  mg total) by mouth daily.  Other orders -     atorvastatin (LIPITOR) 80 MG tablet; Take 1 tablet (80 mg total) by mouth daily at 6 PM. -     apixaban (ELIQUIS) 5 MG TABS tablet; Take 1 tablet (5 mg total) by mouth 2 (two) times daily. -     amLODipine (NORVASC) 10 MG tablet; Take 1 tablet (10 mg total) by mouth daily. -     Lancet Devices (ONE TOUCH DELICA LANCING DEV) MISC; Use as instructed. Inject into the skin once twice daily (FOR ICD-10 E10.9, E11.9). -     glucose blood test strip; Use as instructed. Inject into the skin once twice daily (FOR ICD-10 E10.9, E11.9). -     OneTouch Delica Lancets 17T MISC; 1 applicator by Subdermal route 2 (two) times daily. (FOR ICD-10 E10.9, E11.9). -     Blood Glucose Monitoring Suppl (ONETOUCH VERIO) w/Device KIT; Use as instructed. Inject into the skin once twice daily (FOR ICD-10 E10.9, E11.9). -     escitalopram (LEXAPRO) 10 MG tablet; Take 1 tablet (10 mg total) by mouth daily.     Follow Up Instructions Return in about 2 weeks (around 01/31/2019) for depression.      I discussed the assessment and treatment plan with the patient. The patient was provided an opportunity to ask questions and all were answered. The patient agreed with the plan and demonstrated an understanding of the instructions.   The patient was advised to call back or seek an in-person evaluation if the symptoms worsen or if the condition fails to improve as anticipated.  I provided 21 minutes of non-face-to-face time during this encounter including median intraservice time, reviewing previous notes, labs, imaging, medications and explaining diagnosis and management.  Gildardo Pounds, [name]-BC

## 2019-01-17 NOTE — Progress Notes (Signed)
Has noticed improvement in sleep with Doxepin.  States that blood sugars have been running in the 130s-140s after meals.

## 2019-01-22 ENCOUNTER — Ambulatory Visit (INDEPENDENT_AMBULATORY_CARE_PROVIDER_SITE_OTHER): Payer: Medicare Other | Admitting: *Deleted

## 2019-01-22 DIAGNOSIS — I63443 Cerebral infarction due to embolism of bilateral cerebellar arteries: Secondary | ICD-10-CM

## 2019-01-22 LAB — CUP PACEART REMOTE DEVICE CHECK
Date Time Interrogation Session: 20201214121431
Implantable Pulse Generator Implant Date: 20191216

## 2019-02-07 ENCOUNTER — Ambulatory Visit: Payer: Medicare Other

## 2019-02-11 NOTE — Progress Notes (Signed)
Cardiology Office Note Date:  02/11/2019  Patient ID:  Ricky Bertoli., DOB March 10, 1955, MRN [id number] PCP:  Gildardo Pounds, NP  Electrophysiologist  Dr. Lovena Le     Chief Complaint:  f/u on new a/c  History of Present Illness: Ricky Bright. is a 64 y.o. male with history of HTN, HLD, DM, CVA dec 2019 > loop implant  I saw him in Oct 2020 2/2 remote alert for disconnected monitor, manual transmission noted approx 6.5 days of AFib, was asymptomatic and comes today to discuss further. He was surprised to hear that he had Afib for days since he had no symptoms of any kind.   No CP, palpitations or SOB, no dizziness, near syncope or syncope.  Not with the Afib or otherwise He reported always feeling tired, no more the days of AF then any others.  Fatigue goes back years. He denied any hematological history, no recent surgeries, no bleeding history He had chronic hemorrhage in the deep white matter on the right (noted at the time of his stroke Dec 2019), I reviewed this with neurology NP, not a contraindication for anticoagulation, likely 2/2 HTN He was started on Eliquis, his ASA stopped and planned for early follow up.  He was seen 01/02/2019 by M. Tiller, PA the pt had not started Eliquis yet (despite efforts to get his RX arranged prior to this).  He was instructed to get started on the Eliquis (Rx was available to him ) and follow up once started.  He had a telehealth visit with PMD service, noting recurrent episode of majior depressive discorder, started on doxepin.  He is doing well.  Unfortunately still not on the Eliquis.  At retail pharmacy is extremely expensive and he can not afford it, and the [place] office told him they had not received records to let them know why he was being started, so have not prescribed it for him yet.  He mentioned that he filled out release of information form here and thought that had been taken care of. He denies any symptoms, no CP,  palpitations or cardiac awareness, no SOB or exertional intolerances, has been working on his fence at home, able to do labor intensive activities without any unusual DOE.  No dizzy spells, no near syncope or syncope.  It is best for him to have all his medicines and medical care at the [facility] in one spot.  His BP is high here today, he reports daily checks at home, 140's/80's-90 usually, never gets numbers like here today.  He has not taken his medicines yet, usually takes them about now/mid-morning.    Device information MDT ILR, implanted 01/23/18 for cryptogenic stroke >>> Afib noted Sep 2019   Past Medical History:  Diagnosis Date  . Acute CVA (cerebrovascular accident) ([place]) 01/19/2018  . Constipation, slow transit   . CVA (cerebral vascular accident) ([place]) 01/19/2018  . Diabetes mellitus without complication ([place])   . Dyslipidemia   . Essential hypertension   . HLD (hyperlipidemia)   . Hypercholesteremia   . Hyperlipidemia 01/19/2018  . Hypertension   . Hypertensive urgency 01/19/2018  . 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 Type 2 diabetes mellitus with vascular disease ([place]) 01/19/2018    Past Surgical History:  Procedure Laterality Date  . APPENDECTOMY    . bicep tendon rupture.    [name] LOOP RECORDER INSERTION N/A 01/23/2018   Procedure: LOOP RECORDER INSERTION;  Surgeon: Evans Lance, MD;  Location: [facility] INVASIVE CV  LAB;  Service: Cardiovascular;  Laterality: N/A;  . TEE WITHOUT CARDIOVERSION N/A 01/23/2018   Procedure: TRANSESOPHAGEAL ECHOCARDIOGRAM (TEE);  Surgeon: Larey Dresser, MD;  Location: [facility] ENDOSCOPY;  Service: Cardiovascular;  Laterality: N/A;    Current Outpatient Medications  Medication Sig Dispense Refill  . amLODipine (NORVASC) 10 MG tablet Take 1 tablet (10 mg total) by mouth daily. 90 tablet 1  . apixaban (ELIQUIS) 5 MG TABS tablet Take 1 tablet (5 mg total) by mouth 2 (two) times daily. 60 tablet 2  . atorvastatin (LIPITOR) 80 MG tablet  Take 1 tablet (80 mg total) by mouth daily at 6 PM. 90 tablet 1  . blood glucose meter kit and supplies Dispense based on patient and insurance preference. Use up to four times daily as directed. (FOR ICD-10 E10.9, E11.9). 1 each 0  . Blood Glucose Monitoring Suppl (ONETOUCH VERIO) w/Device KIT Use as instructed. Inject into the skin once twice daily (FOR ICD-10 E10.9, E11.9). 1 kit 0  . doxepin (SINEQUAN) 50 MG capsule Take 1 capsule (50 mg total) by mouth at bedtime. 90 capsule 1  . escitalopram (LEXAPRO) 10 MG tablet Take 1 tablet (10 mg total) by mouth daily. 90 tablet 0  . gabapentin (NEURONTIN) 300 MG capsule Take 2 capsules (600 mg total) by mouth at bedtime. 60 capsule 3  . glucose blood test strip Use as instructed. Inject into the skin once twice daily (FOR ICD-10 E10.9, E11.9). 200 each 12  . Lancet Devices (ONE TOUCH DELICA LANCING DEV) MISC Use as instructed. Inject into the skin once twice daily (FOR ICD-10 E10.9, E11.9). 1 each prn  . lisinopril (ZESTRIL) 20 MG tablet Take 1 tablet (20 mg total) by mouth daily. 90 tablet 1  . metFORMIN (GLUCOPHAGE) 1000 MG tablet Take 1 tablet (1,000 mg total) by mouth daily. 90 tablet 1  . OneTouch Delica Lancets 16X MISC 1 applicator by Subdermal route 2 (two) times daily. (FOR ICD-10 E10.9, E11.9). 200 each 12   No current facility-administered medications for this visit.    Allergies:   Trazodone and nefazodone and Prozac [fluoxetine hcl]   Social History:  The patient  reports that he has never smoked. He has never used smokeless tobacco. He reports previous alcohol use. He reports that he does not use drugs.   Family History:  The patient's family history includes Stroke in his father and paternal grandfather.  ROS:  Please see the history of present illness.  All other systems are reviewed and otherwise negative.   PHYSICAL EXAM:  VS:  There were no vitals taken for this visit. BMI: There is no height or weight on file to calculate BMI.  Well nourished, well developed, in no acute distress  HEENT: normocephalic, atraumatic  Neck: no JVD, carotid bruits or masses Cardiac:  RRR; no significant murmurs, no rubs, or gallops Lungs:  CTA b/l, no wheezing, rhonchi or rales  Abd: soft, nontender MS: no deformity or atrophy Ext: no edema  Skin: warm and dry, no rash Neuro:  No gross deficits appreciated Psych: euthymic mood, full affect  ILR site is stable, no tethering or discomfort   EKG:  Not done today ILR interrogation done today and reviewed by myself: Battery status is good, R waves 0.28 today No further AF since his last visit     01/23/18: TEE Findings: Please see echo section for full report.  Normal LV size with mild to moderate LV hypertrophy.  EF 55-60%. Mild septal bounce noted suggesting LBBB.  Normal RV size and systolic function.  Normal left and right atrial sizes.  No LA appendage thrombus.  There was a small PFO noted by color doppler and bubble study.  No significant tricuspid regurgitation.  Trivial MR.  Trileaflet aortic valve with trivial AI.  No aortic stenosis.  The aortic root was dilated to 4.4 cm, the ascending aorta measured 4.0 cm.  There was mild plaque in the descending thoracic aorta.   Impression: PFO noted.   01/23/18: LE venous doppler  Summary: Right: There is no evidence of deep vein thrombosis in the lower extremity. No cystic structure found in the popliteal fossa. Left: There is no evidence of deep vein thrombosis in the lower extremity. No cystic structure found in the popliteal fossa.   01/21/2018: TTE Study Conclusions - Left ventricle: The cavity size was normal. Wall thickness was increased in a pattern of mild LVH. Systolic function was normal. Although no diagnostic regional wall motion abnormality was identified, this possibility cannot be completely excluded on the basis of this study. Doppler parameters are consistent with abnormal left ventricular  relaxation (grade 1 diastolic dysfunction). - Ventricular septum: Abnormal septal motion likely secondary to conduction delay. - Aortic valve: Trileaflet; mildly thickened leaflets. Transvalvular velocity was within the normal range. There was no stenosis. There was trivial regurgitation. Mean gradient (S): 5 mm Hg. Valve area (VTI): 3.29 cm^2. Valve area (Vmax): 2.67 cm^2. Valve area (Vmean): 2.7 cm^2. - Aortic root: The aortic root was dilated, measuring 49 mm. - Ascending aorta: The ascending aorta was dilated, measuring 43 mm in the proximal segment. - Mitral valve: Mildly calcified annulus. Chordal calcification. Transvalvular velocity was within the normal range. There was no evidence for stenosis. There was trivial regurgitation. - Left atrium: The atrium was normal in size. - Right ventricle: The cavity size was normal. Wall thickness was normal. Systolic function was normal. RV systolic pressure (S, est): 14 mm Hg. - Right atrium: Central venous pressure (est): 8 mm Hg. - Tricuspid valve: There was trivial regurgitation. - Inferior vena cava: The vessel was normal in size. The respirophasic diameter changes were blunted (<50%). - Pericardium, extracardiac: There was no pericardial effusion. Impressions: - No cardiac source of embolism was identified, but cannot be ruled out on the basis of this examination due to image quality. Recommendations: Recommend CT angiogram of the aorta (ECG gated or flash scanned) to evaluate dilated ascending aorta      Recent Labs: 04/17/2018: TSH 2.190 12/08/2018: BUN 11; Creatinine, Ser 1.12; Magnesium 2.0; Potassium 4.1; Sodium 137 12/18/2018: Hemoglobin 15.6; Platelets 314  10/18/2018: Chol/HDL Ratio 6.3; Cholesterol, Total 158; HDL 25; LDL Chol Calc (NIH) 85; Triglycerides 288   CrCl cannot be calculated (Patient's most recent lab result is older than the maximum 21 days allowed.).   Wt Readings from Last 3  Encounters:  01/02/19 252 lb (114.3 kg)  12/08/18 251 lb (113.9 kg)  10/18/18 237 lb 12.8 oz (107.9 kg)     Other studies reviewed: Additional studies/records reviewed today include: summarized above  ASSESSMENT AND PLAN:  1. New Paroxysmal Afib     CHA2DS2Vasc is  4     He had chronic hemorrhage in the deep white matter on the right (noted at the time of his stroke Dec 2019), I reviewed this with neurology NP (with the neurology team that cared for him at [facility]) prior to starting [facility], this finding not felt to be a contraindication for anticoagulation, likely 2/2 HTN He was recommended to started  Eliquis 5 mg BID >> NOT YET STARTED   [facility].  [place] that he was told their anticoagulation clinic would manage via his PMD there He asks me to send my [facility] [place] [facility] D[facility] Primary [facility] 3 Phone [phone] I called the center they confirmed he is an active patient and their secure Fax number is [phone]  We will send his record to their office and he will follow up with his PMD for management of his Afib and a/c management He follows with neurology there, and will continue follow up with their service as well.   2. HTN     Taken a number of times today     Initially by MA was 194/122 I took it manually 170/100     At the end of our visit 172/98     He is going directly home and will take his meds and monitor his BP at home, if no improvement he will let us know     He will continue to monitor daily as well and follow up with his PMD for his BP    Disposition: for now, I will plan for 1 mo follow up to ensure BP and [place] follow up.  If he is settled and folowing up with the [place] we can defer to PRN  Current medicines are reviewed at length with the patient today.  The patient did not have any concerns regarding medicines.  Venetia Night, PA-C 02/11/2019 1:37 PM     [address] [place] [id number] [phone] (office)  [phone] (fax)

## 2019-02-13 ENCOUNTER — Other Ambulatory Visit: Payer: Self-pay

## 2019-02-13 ENCOUNTER — Ambulatory Visit (INDEPENDENT_AMBULATORY_CARE_PROVIDER_SITE_OTHER): Payer: Medicare Other | Admitting: Physician Assistant

## 2019-02-13 VITALS — BP 172/98 | HR 94 | Ht 72.0 in | Wt 246.0 lb

## 2019-02-13 DIAGNOSIS — I10 Essential (primary) hypertension: Secondary | ICD-10-CM

## 2019-02-13 DIAGNOSIS — I480 Paroxysmal atrial fibrillation: Secondary | ICD-10-CM | POA: Diagnosis not present

## 2019-02-13 NOTE — Patient Instructions (Addendum)
Medication Instructions:   TAKE BLOOD PRESSURE AS SOON AS YOU GET HOME AND MONITOR  BLOOD PRESSURE   Your physician recommends that you continue on your current medications as directed. Please refer to the Current Medication list given to you today.  *If you need a refill on your cardiac medications before your next appointment, please call your pharmacy*  Lab Work:  NONE ORDERED  TODAY   If you have labs (blood work) drawn today and your tests are completely normal, you will receive your results only by: [name] MyChart Message (if you have MyChart) OR . A paper copy in the mail If you have any lab test that is abnormal or we need to change your treatment, we will call you to review the results.  Testing/Procedures:  NONE ORDERED  TODAY    Follow-Up: At [facility], you and your health needs are our priority.  As part of our continuing mission to provide you with exceptional heart care, we have created designated Provider Care Teams.  These Care Teams include your primary Cardiologist (physician) and Advanced Practice Providers (APPs -  Physician Assistants and Nurse Practitioners) who all work together to provide you with the care you need, when you need it.  Your next appointment:   1 month(s)  The format for your next appointment:   In Person  Provider:  You may see Francis Dowse, PA-C   Other Instructions

## 2019-02-15 NOTE — Addendum Note (Signed)
Addended by: Oleta Mouse on: 02/15/2019 01:33 PM   Modules accepted: Orders

## 2019-02-20 NOTE — Progress Notes (Signed)
ILR remote 

## 2019-02-25 LAB — CUP PACEART REMOTE DEVICE CHECK
Date Time Interrogation Session: 20210116201349
Implantable Pulse Generator Implant Date: 20191216

## 2019-02-26 ENCOUNTER — Ambulatory Visit (INDEPENDENT_AMBULATORY_CARE_PROVIDER_SITE_OTHER): Payer: Medicare Other | Admitting: *Deleted

## 2019-02-26 DIAGNOSIS — I63443 Cerebral infarction due to embolism of bilateral cerebellar arteries: Secondary | ICD-10-CM

## 2019-02-27 ENCOUNTER — Telehealth: Payer: Self-pay

## 2019-02-27 NOTE — Telephone Encounter (Signed)
Pt states someone called and asked for him to send a transmission.

## 2019-03-16 ENCOUNTER — Telehealth: Payer: Self-pay

## 2019-03-16 NOTE — Telephone Encounter (Signed)
The pt states he do wants to be released to [facility]. The pt has been released.

## 2019-03-16 NOTE — Telephone Encounter (Signed)
LMOVM for pt to give me a call back. I wanted to verify if the pt wanted to be release in [facility] to the [facility].

## 2019-03-19 ENCOUNTER — Ambulatory Visit: Payer: Medicare Other | Admitting: Physician Assistant

## 2020-01-02 IMAGING — MR MR HEAD WO/W CM
10 of 12 series · 35 of 48 positions shown · IV contrast (Yes   MULTIHANCE)
Comparison: CT head 01/19/2018

CLINICAL DATA: Dizziness onset 1 week ago. Also diabetes
hyperlipidemia hypertension history. Recent falls.

EXAM:
MRI HEAD WITHOUT AND WITH CONTRAST
TECHNIQUE: Multiplanar, multiecho pulse sequences of the brain and surrounding
structures were obtained without and with intravenous contrast.
CONTRAST:  10 mL Gadovist IV

[Series 3: DWI · axial · 3.0mm · 1.09mm/px · z∈[-74,+76]mm · 9 of 102 slices shown (1 of 4)]
[im 1/102]
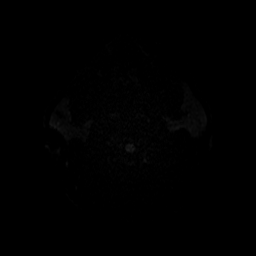
[im 13/102]
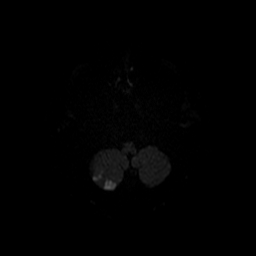
[im 26/102]
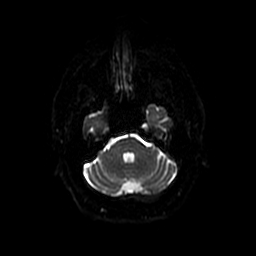
[im 38/102]
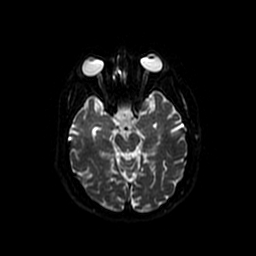
[im 51/102]
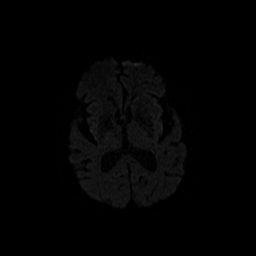
[im 64/102]
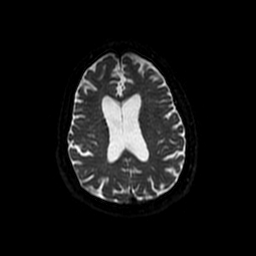
[im 76/102]
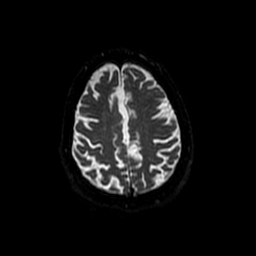
[im 89/102]
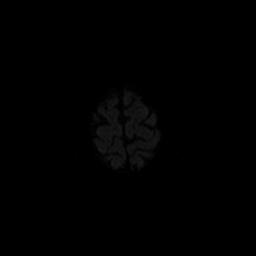
[im 102/102]
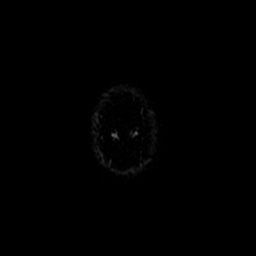

[Series 4: DWI · coronal · 5.0mm · 1.09mm/px · 7 of 72 slices shown (2 of 4)]
[im 1/72]
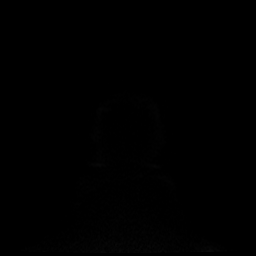
[im 12/72]
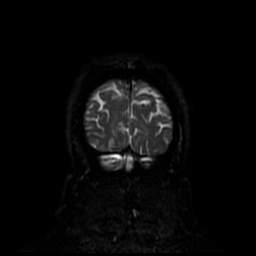
[im 24/72]
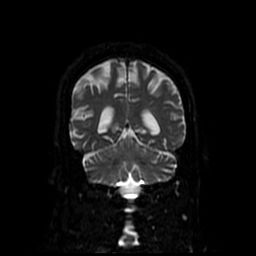
[im 36/72]
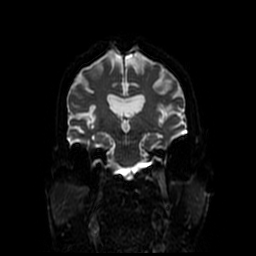
[im 48/72]
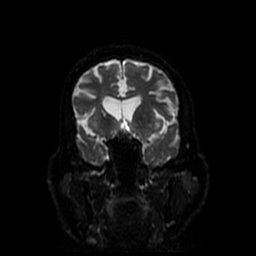
[im 60/72]
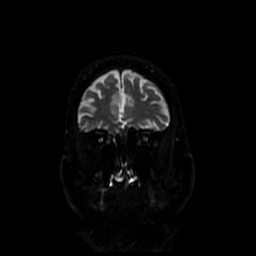
[im 72/72]
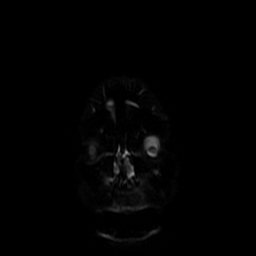

[Series 5: T1 · sagittal · 5.0mm · 0.47mm/px · 2 of 25 slices shown]
[im 1/25]
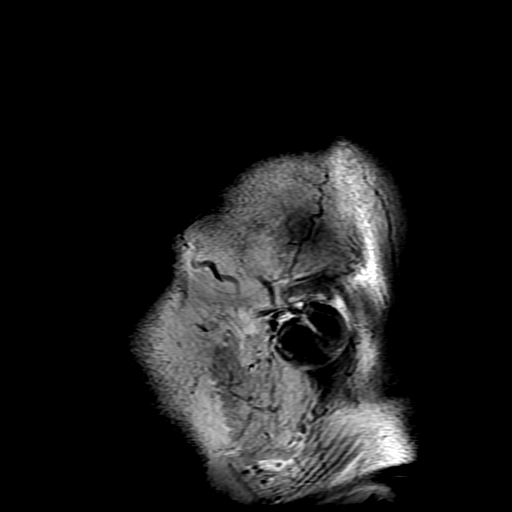
[im 25/25]
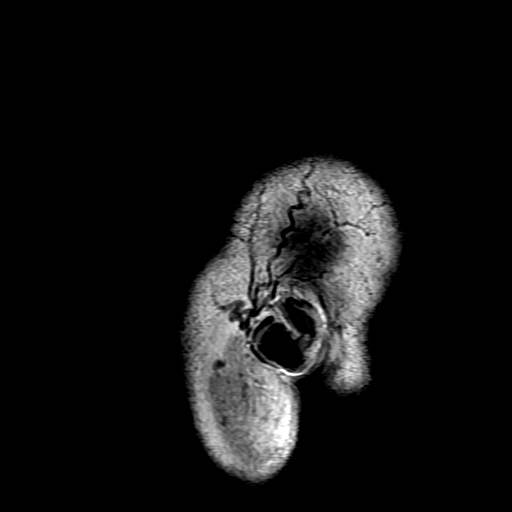

[Series 6: T2 · axial · 5.0mm · 0.43mm/px · z∈[-76,+74]mm · 2 of 26 slices shown (1 of 2)]
[im 1/26]
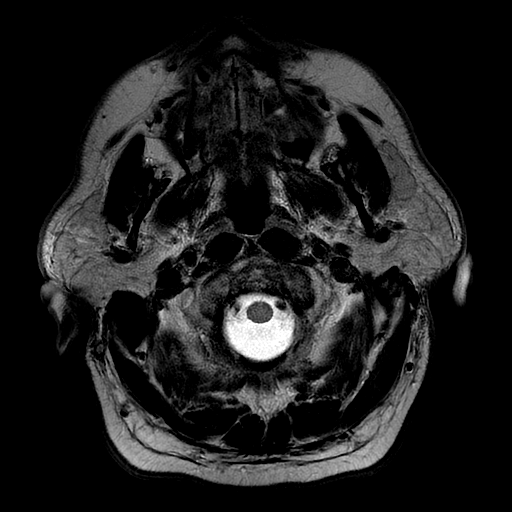
[im 26/26]
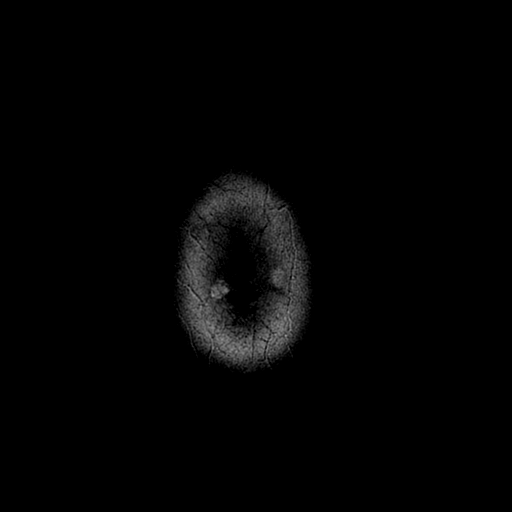

[Series 7: FLAIR · axial · 3.0mm · 0.43mm/px · z∈[-76,+74]mm · 2 of 26 slices shown]
[im 1/26]
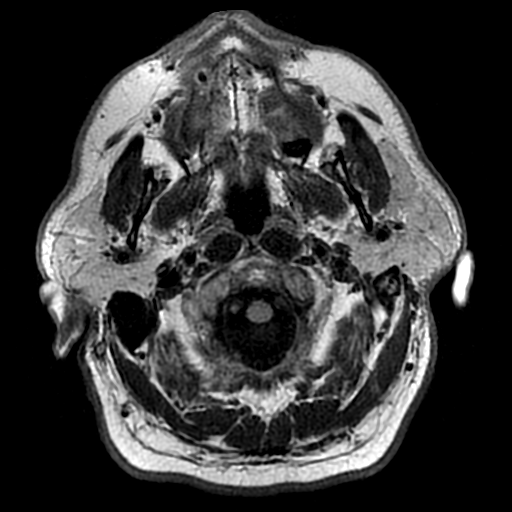
[im 26/26]
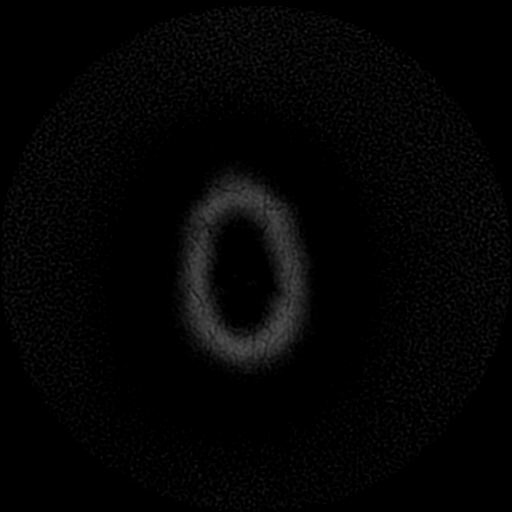

[Series 8: ax mpgr · axial · 5.0mm · 0.43mm/px · z∈[-76,+74]mm · 2 of 26 slices shown]
[im 1/26]
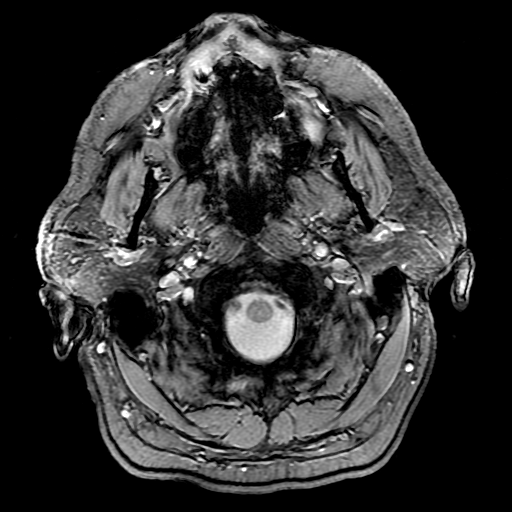
[im 26/26]
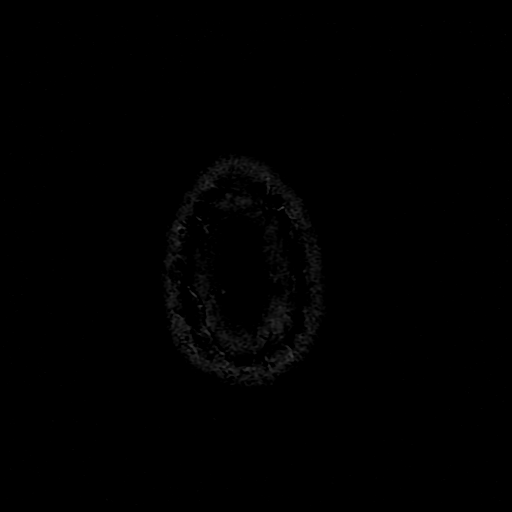

[Series 10: T2 · coronal · 5.0mm · 0.43mm/px · 2 of 30 slices shown (2 of 2)]
[im 1/30]
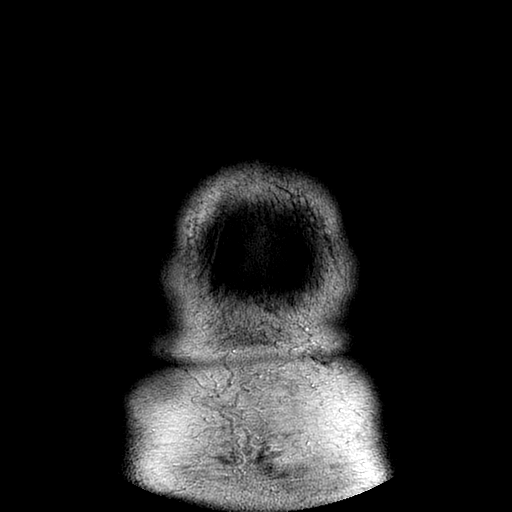
[im 30/30]
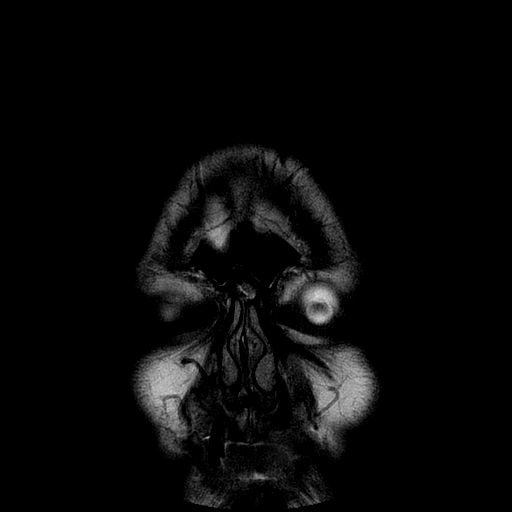

[Series 12: T1 post-contrast · coronal · 5.0mm · 0.43mm/px · 2 of 30 slices shown]
[im 1/30]
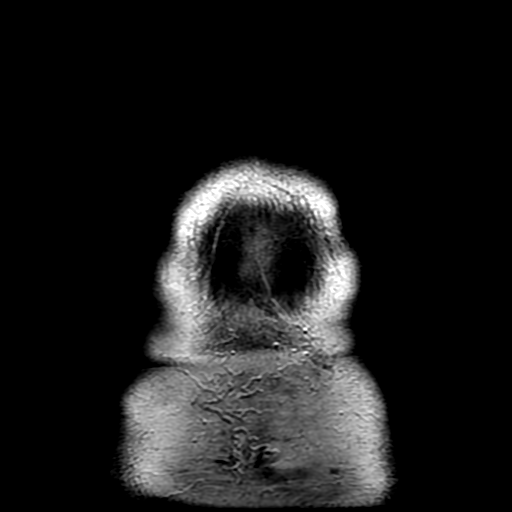
[im 30/30]
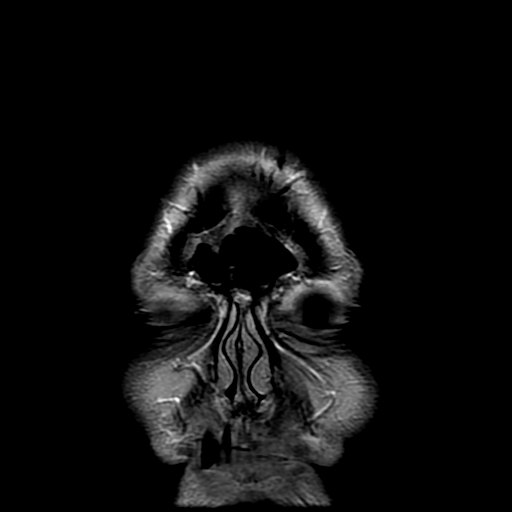

[Series 300: DWI · axial · 3.0mm · 1.09mm/px · z∈[-74,+76]mm · 4 of 51 slices shown (3 of 4)]
[im 1/51]
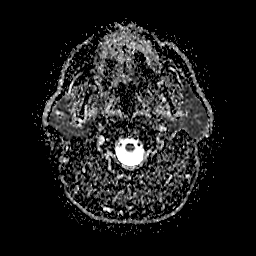
[im 17/51]
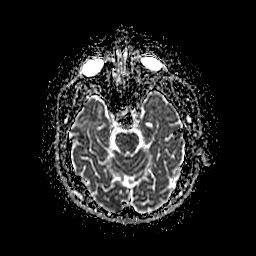
[im 34/51]
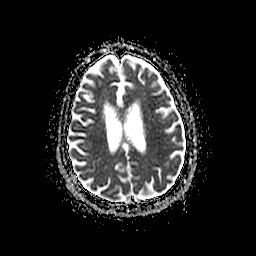
[im 51/51]
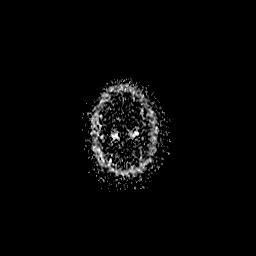

[Series 400: DWI · coronal · 5.0mm · 1.09mm/px · 3 of 36 slices shown (4 of 4)]
[im 1/36]
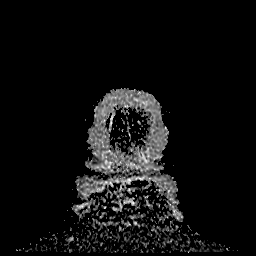
[im 18/36]
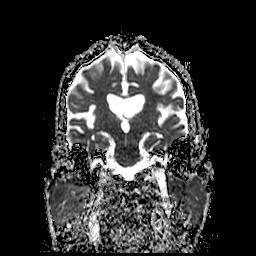
[im 36/36]
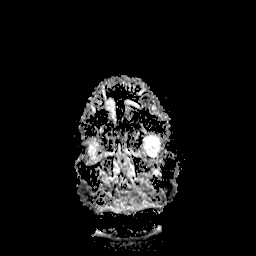

[35 of 48 positions shown; findings below may reference images not displayed]

FINDINGS: Brain: Multiple areas of acute/subacute infarct with restricted
diffusion. Several areas show patchy enhancement suggesting subacute
duration. Patchy areas of restricted diffusion right cerebellum with
enhancement. Restricted diffusion in the right occipital pole also
with mild enhancement. Small area of restricted diffusion in the
right thalamus. Small area of enhancement in the right caudate
consistent with subacute infarct. Small area of restricted diffusion
in the left cerebellum without enhancement.

Chronic infarct in the deep white matter on the right. This shows
evidence of prior mild hemorrhage. Mild atrophy.

Negative for mass lesion.

Vascular: Normal arterial flow voids

Skull and upper cervical spine: Negative

Sinuses/Orbits: Moderate mucosal edema paranasal sinuses. Normal
orbit

Other: None
IMPRESSION: Multiple areas of acute/subacute infarct including the cerebellum
bilaterally, right occipital lobe, right thalamus, and right
caudate. Several of these show enhancement suggesting subacute
duration. CTA head neck recommended for further vascular evaluation.

Chronic hemorrhage in the deep white matter on the right. Mild
atrophy.

These results were called by telephone at the time of interpretation
on 01/19/2018 at [DATE] to Rudi Jumper PA , who verbally
acknowledged these results.
